# Patient Record
Sex: Male | Born: 1960 | Race: White | Hispanic: No | Marital: Married
Health system: Midwestern US, Academic
[De-identification: ages and names within clinical notes are randomized; demographics above are authoritative.]

---

## 2017-09-11 ENCOUNTER — Encounter: Admit: 2017-09-11 | Discharge: 2017-09-11 | Payer: MEDICARE

## 2017-09-14 ENCOUNTER — Encounter: Admit: 2017-09-14 | Discharge: 2017-09-14 | Payer: MEDICARE

## 2017-09-14 DIAGNOSIS — I10 Essential (primary) hypertension: ICD-10-CM

## 2017-09-14 DIAGNOSIS — E785 Hyperlipidemia, unspecified: Principal | ICD-10-CM

## 2017-09-18 ENCOUNTER — Encounter: Admit: 2017-09-18 | Discharge: 2017-09-18 | Payer: MEDICARE

## 2017-09-18 ENCOUNTER — Ambulatory Visit: Admit: 2017-09-18 | Discharge: 2017-09-19 | Payer: MEDICARE

## 2017-09-18 DIAGNOSIS — I2510 Atherosclerotic heart disease of native coronary artery without angina pectoris: Principal | ICD-10-CM

## 2017-09-18 DIAGNOSIS — E785 Hyperlipidemia, unspecified: Principal | ICD-10-CM

## 2017-09-18 DIAGNOSIS — I10 Essential (primary) hypertension: ICD-10-CM

## 2017-09-18 DIAGNOSIS — R0600 Dyspnea, unspecified: ICD-10-CM

## 2017-09-20 ENCOUNTER — Encounter: Admit: 2017-09-20 | Discharge: 2017-09-20 | Payer: MEDICARE

## 2017-10-01 ENCOUNTER — Encounter: Admit: 2017-10-01 | Discharge: 2017-10-01 | Payer: MEDICARE

## 2017-10-01 ENCOUNTER — Ambulatory Visit: Admit: 2017-10-01 | Discharge: 2017-10-02 | Payer: MEDICARE

## 2017-10-01 DIAGNOSIS — I7090 Unspecified atherosclerosis: Principal | ICD-10-CM

## 2017-10-10 ENCOUNTER — Encounter: Admit: 2017-10-10 | Discharge: 2017-10-10 | Payer: MEDICARE

## 2017-10-11 ENCOUNTER — Encounter: Admit: 2017-10-11 | Discharge: 2017-10-11 | Payer: MEDICARE

## 2017-10-11 DIAGNOSIS — I519 Heart disease, unspecified: ICD-10-CM

## 2017-10-11 DIAGNOSIS — E785 Hyperlipidemia, unspecified: Principal | ICD-10-CM

## 2017-10-11 DIAGNOSIS — I10 Essential (primary) hypertension: ICD-10-CM

## 2017-10-12 ENCOUNTER — Encounter: Admit: 2017-10-12 | Discharge: 2017-10-12 | Payer: MEDICARE

## 2017-10-12 ENCOUNTER — Ambulatory Visit: Admit: 2017-10-12 | Discharge: 2017-10-13 | Payer: MEDICARE

## 2017-10-12 DIAGNOSIS — G061 Intraspinal abscess and granuloma: ICD-10-CM

## 2017-10-12 DIAGNOSIS — I2510 Atherosclerotic heart disease of native coronary artery without angina pectoris: ICD-10-CM

## 2017-10-12 DIAGNOSIS — E785 Hyperlipidemia, unspecified: Principal | ICD-10-CM

## 2017-10-12 DIAGNOSIS — I10 Essential (primary) hypertension: ICD-10-CM

## 2017-10-12 MED ORDER — LEVETIRACETAM 500 MG PO TAB
500 mg | ORAL_TABLET | Freq: Two times a day (BID) | ORAL | 5 refills | 90.00000 days | Status: AC
Start: 2017-10-12 — End: 2018-04-02

## 2017-10-13 DIAGNOSIS — G253 Myoclonus: Principal | ICD-10-CM

## 2017-10-13 DIAGNOSIS — G959 Disease of spinal cord, unspecified: ICD-10-CM

## 2017-10-22 ENCOUNTER — Ambulatory Visit: Admit: 2017-10-22 | Discharge: 2017-10-22 | Payer: MEDICARE

## 2017-10-22 DIAGNOSIS — G253 Myoclonus: Principal | ICD-10-CM

## 2017-10-22 DIAGNOSIS — G959 Disease of spinal cord, unspecified: ICD-10-CM

## 2017-10-22 MED ORDER — GADOBENATE DIMEGLUMINE 529 MG/ML (0.1MMOL/0.2ML) IV SOLN
17 mL | Freq: Once | INTRAVENOUS | 0 refills | Status: CP
Start: 2017-10-22 — End: ?
  Administered 2017-10-22: 19:00:00 17 mL via INTRAVENOUS

## 2017-10-23 ENCOUNTER — Ambulatory Visit: Admit: 2017-10-23 | Discharge: 2017-10-23 | Payer: MEDICARE

## 2017-10-23 DIAGNOSIS — G253 Myoclonus: Principal | ICD-10-CM

## 2017-10-23 DIAGNOSIS — G959 Disease of spinal cord, unspecified: ICD-10-CM

## 2017-10-23 MED ORDER — GADOBENATE DIMEGLUMINE 529 MG/ML (0.1MMOL/0.2ML) IV SOLN
17 mL | Freq: Once | INTRAVENOUS | 0 refills | Status: CP
Start: 2017-10-23 — End: ?
  Administered 2017-10-23: 16:00:00 17 mL via INTRAVENOUS

## 2017-10-25 ENCOUNTER — Encounter: Admit: 2017-10-25 | Discharge: 2017-10-25 | Payer: MEDICARE

## 2017-10-31 ENCOUNTER — Encounter: Admit: 2017-10-31 | Discharge: 2017-10-31 | Payer: MEDICARE

## 2017-10-31 DIAGNOSIS — R9389 Abnormal findings on diagnostic imaging of other specified body structures: Principal | ICD-10-CM

## 2017-11-01 ENCOUNTER — Encounter: Admit: 2017-11-01 | Discharge: 2017-11-01 | Payer: MEDICARE

## 2017-11-08 ENCOUNTER — Ambulatory Visit: Admit: 2017-11-08 | Discharge: 2017-11-09 | Payer: MEDICARE

## 2017-11-08 ENCOUNTER — Encounter: Admit: 2017-11-08 | Discharge: 2017-11-08 | Payer: MEDICARE

## 2017-11-08 DIAGNOSIS — G061 Intraspinal abscess and granuloma: ICD-10-CM

## 2017-11-08 DIAGNOSIS — M5134 Other intervertebral disc degeneration, thoracic region: ICD-10-CM

## 2017-11-08 DIAGNOSIS — M4800 Spinal stenosis, site unspecified: ICD-10-CM

## 2017-11-08 DIAGNOSIS — I10 Essential (primary) hypertension: ICD-10-CM

## 2017-11-08 DIAGNOSIS — M47812 Spondylosis without myelopathy or radiculopathy, cervical region: Principal | ICD-10-CM

## 2017-11-08 DIAGNOSIS — T148XXA Other injury of unspecified body region, initial encounter: ICD-10-CM

## 2017-11-08 DIAGNOSIS — M5030 Other cervical disc degeneration, unspecified cervical region: ICD-10-CM

## 2017-11-08 DIAGNOSIS — I2510 Atherosclerotic heart disease of native coronary artery without angina pectoris: ICD-10-CM

## 2017-11-08 DIAGNOSIS — E785 Hyperlipidemia, unspecified: Principal | ICD-10-CM

## 2017-11-09 DIAGNOSIS — G959 Disease of spinal cord, unspecified: Secondary | ICD-10-CM

## 2017-11-09 DIAGNOSIS — G253 Myoclonus: ICD-10-CM

## 2017-11-12 ENCOUNTER — Encounter: Admit: 2017-11-12 | Discharge: 2017-11-12 | Payer: MEDICARE

## 2017-11-20 ENCOUNTER — Encounter: Admit: 2017-11-20 | Discharge: 2017-11-20 | Payer: MEDICARE

## 2017-11-21 ENCOUNTER — Encounter: Admit: 2017-11-21 | Discharge: 2017-11-21 | Payer: MEDICARE

## 2017-11-21 DIAGNOSIS — G959 Disease of spinal cord, unspecified: Principal | ICD-10-CM

## 2017-11-27 ENCOUNTER — Encounter: Admit: 2017-11-27 | Discharge: 2017-11-27 | Payer: MEDICARE

## 2017-11-27 ENCOUNTER — Ambulatory Visit: Admit: 2017-11-27 | Discharge: 2017-11-27 | Payer: MEDICARE

## 2017-11-27 DIAGNOSIS — M5030 Other cervical disc degeneration, unspecified cervical region: ICD-10-CM

## 2017-11-27 DIAGNOSIS — M5134 Other intervertebral disc degeneration, thoracic region: ICD-10-CM

## 2017-11-27 DIAGNOSIS — I10 Essential (primary) hypertension: ICD-10-CM

## 2017-11-27 DIAGNOSIS — M4800 Spinal stenosis, site unspecified: ICD-10-CM

## 2017-11-27 DIAGNOSIS — S14109S Unspecified injury at unspecified level of cervical spinal cord, sequela: ICD-10-CM

## 2017-11-27 DIAGNOSIS — M5412 Radiculopathy, cervical region: ICD-10-CM

## 2017-11-27 DIAGNOSIS — T148XXA Other injury of unspecified body region, initial encounter: ICD-10-CM

## 2017-11-27 DIAGNOSIS — R9389 Abnormal findings on diagnostic imaging of other specified body structures: ICD-10-CM

## 2017-11-27 DIAGNOSIS — R258 Other abnormal involuntary movements: ICD-10-CM

## 2017-11-27 DIAGNOSIS — I2510 Atherosclerotic heart disease of native coronary artery without angina pectoris: ICD-10-CM

## 2017-11-27 DIAGNOSIS — G959 Disease of spinal cord, unspecified: Principal | ICD-10-CM

## 2017-11-27 DIAGNOSIS — E785 Hyperlipidemia, unspecified: Principal | ICD-10-CM

## 2017-11-27 DIAGNOSIS — G061 Intraspinal abscess and granuloma: ICD-10-CM

## 2017-11-28 ENCOUNTER — Encounter: Admit: 2017-11-28 | Discharge: 2017-11-28 | Payer: MEDICARE

## 2017-11-30 ENCOUNTER — Encounter: Admit: 2017-11-30 | Discharge: 2017-11-30 | Payer: MEDICARE

## 2018-01-04 ENCOUNTER — Encounter: Admit: 2018-01-04 | Discharge: 2018-01-04 | Payer: MEDICARE

## 2018-01-04 ENCOUNTER — Ambulatory Visit: Admit: 2018-01-04 | Discharge: 2018-01-04 | Payer: MEDICARE

## 2018-01-04 ENCOUNTER — Encounter: Admit: 2018-01-04 | Discharge: 2018-01-05 | Payer: MEDICARE

## 2018-01-04 DIAGNOSIS — M5134 Other intervertebral disc degeneration, thoracic region: ICD-10-CM

## 2018-01-04 DIAGNOSIS — M5412 Radiculopathy, cervical region: ICD-10-CM

## 2018-01-04 DIAGNOSIS — I10 Essential (primary) hypertension: ICD-10-CM

## 2018-01-04 DIAGNOSIS — M4800 Spinal stenosis, site unspecified: ICD-10-CM

## 2018-01-04 DIAGNOSIS — E785 Hyperlipidemia, unspecified: Principal | ICD-10-CM

## 2018-01-04 DIAGNOSIS — S14109S Unspecified injury at unspecified level of cervical spinal cord, sequela: ICD-10-CM

## 2018-01-04 DIAGNOSIS — T148XXA Other injury of unspecified body region, initial encounter: ICD-10-CM

## 2018-01-04 DIAGNOSIS — G061 Intraspinal abscess and granuloma: ICD-10-CM

## 2018-01-04 DIAGNOSIS — G959 Disease of spinal cord, unspecified: Principal | ICD-10-CM

## 2018-01-04 DIAGNOSIS — R258 Other abnormal involuntary movements: ICD-10-CM

## 2018-01-04 DIAGNOSIS — M5030 Other cervical disc degeneration, unspecified cervical region: ICD-10-CM

## 2018-01-04 DIAGNOSIS — I2510 Atherosclerotic heart disease of native coronary artery without angina pectoris: ICD-10-CM

## 2018-01-04 MED ORDER — DEXAMETHASONE SODIUM PHOS (PF) 10 MG/ML IJ SOLN
10 mg | Freq: Once | 0 refills | Status: CP
Start: 2018-01-04 — End: ?

## 2018-01-04 MED ORDER — IOPAMIDOL 41 % IT SOLN
2.5 mL | Freq: Once | EPIDURAL | 0 refills | Status: CP
Start: 2018-01-04 — End: ?

## 2018-01-06 ENCOUNTER — Encounter: Admit: 2018-01-06 | Discharge: 2018-01-06 | Payer: MEDICARE

## 2018-01-10 ENCOUNTER — Encounter: Admit: 2018-01-10 | Discharge: 2018-01-10 | Payer: MEDICARE

## 2018-01-18 ENCOUNTER — Encounter: Admit: 2018-01-18 | Discharge: 2018-01-18 | Payer: MEDICARE

## 2018-01-18 DIAGNOSIS — M5412 Radiculopathy, cervical region: Principal | ICD-10-CM

## 2018-01-23 ENCOUNTER — Encounter: Admit: 2018-01-23 | Discharge: 2018-01-23 | Payer: MEDICARE

## 2018-02-11 ENCOUNTER — Encounter: Admit: 2018-02-11 | Discharge: 2018-02-11 | Payer: MEDICARE

## 2018-02-11 DIAGNOSIS — M4800 Spinal stenosis, site unspecified: ICD-10-CM

## 2018-02-11 DIAGNOSIS — I10 Essential (primary) hypertension: ICD-10-CM

## 2018-02-11 DIAGNOSIS — E785 Hyperlipidemia, unspecified: Principal | ICD-10-CM

## 2018-02-11 DIAGNOSIS — T148XXA Other injury of unspecified body region, initial encounter: ICD-10-CM

## 2018-02-11 DIAGNOSIS — I2510 Atherosclerotic heart disease of native coronary artery without angina pectoris: ICD-10-CM

## 2018-02-11 DIAGNOSIS — G061 Intraspinal abscess and granuloma: ICD-10-CM

## 2018-02-11 DIAGNOSIS — M5134 Other intervertebral disc degeneration, thoracic region: ICD-10-CM

## 2018-02-11 DIAGNOSIS — M5030 Other cervical disc degeneration, unspecified cervical region: ICD-10-CM

## 2018-02-13 ENCOUNTER — Encounter: Admit: 2018-02-13 | Discharge: 2018-02-13 | Payer: MEDICARE

## 2018-02-15 ENCOUNTER — Encounter: Admit: 2018-02-15 | Discharge: 2018-02-15 | Payer: MEDICARE

## 2018-02-15 ENCOUNTER — Ambulatory Visit: Admit: 2018-02-15 | Discharge: 2018-02-15 | Payer: MEDICARE

## 2018-02-15 DIAGNOSIS — M5412 Radiculopathy, cervical region: Principal | ICD-10-CM

## 2018-02-15 DIAGNOSIS — M4800 Spinal stenosis, site unspecified: ICD-10-CM

## 2018-02-15 DIAGNOSIS — M5134 Other intervertebral disc degeneration, thoracic region: ICD-10-CM

## 2018-02-15 DIAGNOSIS — T148XXA Other injury of unspecified body region, initial encounter: ICD-10-CM

## 2018-02-15 DIAGNOSIS — M5030 Other cervical disc degeneration, unspecified cervical region: ICD-10-CM

## 2018-02-15 DIAGNOSIS — I10 Essential (primary) hypertension: ICD-10-CM

## 2018-02-15 DIAGNOSIS — I2510 Atherosclerotic heart disease of native coronary artery without angina pectoris: ICD-10-CM

## 2018-02-15 DIAGNOSIS — G061 Intraspinal abscess and granuloma: ICD-10-CM

## 2018-02-15 DIAGNOSIS — E785 Hyperlipidemia, unspecified: Principal | ICD-10-CM

## 2018-02-15 MED ORDER — IOPAMIDOL 41 % IT SOLN
2.5 mL | Freq: Once | EPIDURAL | 0 refills | Status: CP
Start: 2018-02-15 — End: ?

## 2018-02-15 MED ORDER — LIDOCAINE (PF) 10 MG/ML (1 %) IJ SOLN
2 mL | Freq: Once | INTRAMUSCULAR | 0 refills | Status: CP
Start: 2018-02-15 — End: ?

## 2018-02-15 MED ORDER — DEXAMETHASONE SODIUM PHOS (PF) 10 MG/ML IJ SOLN
10 mg | Freq: Once | 0 refills | Status: CP
Start: 2018-02-15 — End: ?

## 2018-02-26 ENCOUNTER — Encounter: Admit: 2018-02-26 | Discharge: 2018-02-26 | Payer: MEDICARE

## 2018-02-26 ENCOUNTER — Ambulatory Visit: Admit: 2018-02-26 | Discharge: 2018-02-27 | Payer: MEDICARE

## 2018-02-26 DIAGNOSIS — M4800 Spinal stenosis, site unspecified: ICD-10-CM

## 2018-02-26 DIAGNOSIS — G061 Intraspinal abscess and granuloma: ICD-10-CM

## 2018-02-26 DIAGNOSIS — M5030 Other cervical disc degeneration, unspecified cervical region: ICD-10-CM

## 2018-02-26 DIAGNOSIS — I10 Essential (primary) hypertension: ICD-10-CM

## 2018-02-26 DIAGNOSIS — E785 Hyperlipidemia, unspecified: Principal | ICD-10-CM

## 2018-02-26 DIAGNOSIS — M5134 Other intervertebral disc degeneration, thoracic region: ICD-10-CM

## 2018-02-26 DIAGNOSIS — T148XXA Other injury of unspecified body region, initial encounter: ICD-10-CM

## 2018-02-26 DIAGNOSIS — I2510 Atherosclerotic heart disease of native coronary artery without angina pectoris: ICD-10-CM

## 2018-02-27 DIAGNOSIS — G959 Disease of spinal cord, unspecified: Secondary | ICD-10-CM

## 2018-02-27 DIAGNOSIS — R258 Other abnormal involuntary movements: ICD-10-CM

## 2018-02-27 DIAGNOSIS — M5412 Radiculopathy, cervical region: Principal | ICD-10-CM

## 2018-02-27 DIAGNOSIS — S14109S Unspecified injury at unspecified level of cervical spinal cord, sequela: ICD-10-CM

## 2018-03-08 ENCOUNTER — Encounter: Admit: 2018-03-08 | Discharge: 2018-03-08 | Payer: MEDICARE

## 2018-03-08 DIAGNOSIS — S14109D Unspecified injury at unspecified level of cervical spinal cord, subsequent encounter: ICD-10-CM

## 2018-03-08 DIAGNOSIS — Z419 Encounter for procedure for purposes other than remedying health state, unspecified: ICD-10-CM

## 2018-03-08 DIAGNOSIS — M5412 Radiculopathy, cervical region: Principal | ICD-10-CM

## 2018-03-08 DIAGNOSIS — G959 Disease of spinal cord, unspecified: ICD-10-CM

## 2018-03-12 ENCOUNTER — Encounter: Admit: 2018-03-12 | Discharge: 2018-03-12 | Payer: MEDICARE

## 2018-03-12 DIAGNOSIS — S14109D Unspecified injury at unspecified level of cervical spinal cord, subsequent encounter: ICD-10-CM

## 2018-04-02 ENCOUNTER — Encounter: Admit: 2018-04-02 | Discharge: 2018-04-02 | Payer: MEDICARE

## 2018-04-02 MED ORDER — LEVETIRACETAM 500 MG PO TAB
500 mg | ORAL_TABLET | Freq: Two times a day (BID) | ORAL | 0 refills | 90.00000 days | Status: AC
Start: 2018-04-02 — End: 2018-05-07

## 2018-05-06 ENCOUNTER — Encounter: Admit: 2018-05-06 | Discharge: 2018-05-06 | Payer: MEDICARE

## 2018-05-07 ENCOUNTER — Encounter: Admit: 2018-05-07 | Discharge: 2018-05-07 | Payer: MEDICARE

## 2018-05-07 MED ORDER — LEVETIRACETAM 500 MG PO TAB
500 mg | ORAL_TABLET | Freq: Two times a day (BID) | ORAL | 1 refills | 90.00000 days | Status: AC
Start: 2018-05-07 — End: 2018-07-05

## 2018-06-04 ENCOUNTER — Encounter: Admit: 2018-06-04 | Discharge: 2018-06-04 | Payer: MEDICARE

## 2018-06-11 ENCOUNTER — Encounter: Admit: 2018-06-11 | Discharge: 2018-06-11 | Payer: MEDICARE

## 2018-06-11 ENCOUNTER — Ambulatory Visit: Admit: 2018-06-11 | Discharge: 2018-06-12 | Payer: MEDICARE

## 2018-06-11 DIAGNOSIS — I2510 Atherosclerotic heart disease of native coronary artery without angina pectoris: Principal | ICD-10-CM

## 2018-07-05 ENCOUNTER — Encounter: Admit: 2018-07-05 | Discharge: 2018-07-05 | Payer: MEDICARE

## 2018-07-05 ENCOUNTER — Ambulatory Visit: Admit: 2018-07-05 | Discharge: 2018-07-05 | Payer: MEDICARE

## 2018-07-05 DIAGNOSIS — G061 Intraspinal abscess and granuloma: Secondary | ICD-10-CM

## 2018-07-05 DIAGNOSIS — E785 Hyperlipidemia, unspecified: Secondary | ICD-10-CM

## 2018-07-05 DIAGNOSIS — G253 Myoclonus: Secondary | ICD-10-CM

## 2018-07-05 DIAGNOSIS — R208 Other disturbances of skin sensation: Secondary | ICD-10-CM

## 2018-07-05 DIAGNOSIS — T148XXA Other injury of unspecified body region, initial encounter: Secondary | ICD-10-CM

## 2018-07-05 DIAGNOSIS — I2510 Atherosclerotic heart disease of native coronary artery without angina pectoris: Secondary | ICD-10-CM

## 2018-07-05 DIAGNOSIS — M5030 Other cervical disc degeneration, unspecified cervical region: Secondary | ICD-10-CM

## 2018-07-05 DIAGNOSIS — M5134 Other intervertebral disc degeneration, thoracic region: Secondary | ICD-10-CM

## 2018-07-05 DIAGNOSIS — G959 Disease of spinal cord, unspecified: Secondary | ICD-10-CM

## 2018-07-05 DIAGNOSIS — M4800 Spinal stenosis, site unspecified: Secondary | ICD-10-CM

## 2018-07-05 DIAGNOSIS — I10 Essential (primary) hypertension: Secondary | ICD-10-CM

## 2018-07-05 LAB — COMPREHENSIVE METABOLIC PANEL
Lab: 0.7 mg/dL (ref 0.3–1.2)
Lab: 1.2 mg/dL (ref 0.4–1.24)
Lab: 104 MMOL/L (ref 98–110)
Lab: 138 MMOL/L (ref 137–147)
Lab: 24 MMOL/L (ref 21–30)
Lab: 3.9 MMOL/L (ref 3.5–5.1)
Lab: 34 U/L (ref 7–40)
Lab: 4.4 g/dL (ref 3.5–5.0)
Lab: 56 U/L (ref 25–110)
Lab: 6.9 g/dL (ref 6.0–8.0)
Lab: 68 U/L — ABNORMAL HIGH (ref 7–56)
Lab: 9 mg/dL (ref 7–25)
Lab: 9.3 mg/dL (ref 8.5–10.6)
Lab: 94 mg/dL (ref 70–100)
Lab: 10 (ref 3–12)

## 2018-07-05 LAB — FOLATE, SERUM: Lab: 14 ng/mL (ref >3.9)

## 2018-07-05 LAB — SED RATE: Lab: 6 mm/h (ref 0–20)

## 2018-07-05 LAB — VITAMIN B12: Lab: 486 pg/mL (ref 180–914)

## 2018-07-05 MED ORDER — GABAPENTIN 600 MG PO TAB
ORAL_TABLET | Freq: Three times a day (TID) | 3 refills | Status: AC
Start: 2018-07-05 — End: 2019-04-01

## 2018-07-05 MED ORDER — LEVETIRACETAM 1,000 MG PO TAB
1000 mg | ORAL_TABLET | Freq: Two times a day (BID) | ORAL | 3 refills | 90.00000 days | Status: AC
Start: 2018-07-05 — End: 2019-04-01

## 2018-07-06 LAB — ZINC: Lab: 0.8

## 2018-07-06 LAB — COPPER: Lab: 1.1

## 2018-07-06 LAB — HIV 1& 2 AG-AB SCRN W REFLEX HIV 1 PCR QUANT: Lab: NEGATIVE

## 2018-07-07 LAB — SYPHILIS AB SCREEN: Lab: POSITIVE — AB

## 2018-07-08 ENCOUNTER — Encounter: Admit: 2018-07-08 | Discharge: 2018-07-08 | Payer: MEDICARE

## 2018-07-08 LAB — ANTI-NUCLEAR ANTIBODY(ANA): Lab: <80 {titer} (ref <80)

## 2018-07-08 LAB — RPR SCREEN

## 2018-07-08 LAB — PYRIDOXAL 5 PHOSPHATE: Lab: 8

## 2018-07-09 LAB — VITAMIN E: Lab: 11

## 2018-07-09 LAB — METHYLMALONIC ACID QUANT: Lab: 0.1

## 2018-07-10 LAB — T. PALLIDUM AB TP-PA

## 2018-07-12 ENCOUNTER — Encounter: Admit: 2018-07-12 | Discharge: 2018-07-12 | Payer: MEDICARE

## 2018-07-29 ENCOUNTER — Encounter: Admit: 2018-07-29 | Discharge: 2018-07-29 | Payer: MEDICARE

## 2018-08-07 ENCOUNTER — Encounter: Admit: 2018-08-07 | Discharge: 2018-08-07 | Payer: MEDICARE

## 2018-08-07 DIAGNOSIS — G959 Disease of spinal cord, unspecified: Principal | ICD-10-CM

## 2018-08-08 ENCOUNTER — Encounter: Admit: 2018-08-08 | Discharge: 2018-08-08 | Payer: MEDICARE

## 2018-08-08 ENCOUNTER — Ambulatory Visit: Admit: 2018-08-08 | Discharge: 2018-08-08 | Payer: MEDICARE

## 2018-08-08 DIAGNOSIS — T148XXA Other injury of unspecified body region, initial encounter: ICD-10-CM

## 2018-08-08 DIAGNOSIS — I2510 Atherosclerotic heart disease of native coronary artery without angina pectoris: ICD-10-CM

## 2018-08-08 DIAGNOSIS — G959 Disease of spinal cord, unspecified: Principal | ICD-10-CM

## 2018-08-08 DIAGNOSIS — S14109S Unspecified injury at unspecified level of cervical spinal cord, sequela: ICD-10-CM

## 2018-08-08 DIAGNOSIS — M5030 Other cervical disc degeneration, unspecified cervical region: ICD-10-CM

## 2018-08-08 DIAGNOSIS — M5134 Other intervertebral disc degeneration, thoracic region: ICD-10-CM

## 2018-08-08 DIAGNOSIS — M5412 Radiculopathy, cervical region: ICD-10-CM

## 2018-08-08 DIAGNOSIS — I10 Essential (primary) hypertension: ICD-10-CM

## 2018-08-08 DIAGNOSIS — S14109D Unspecified injury at unspecified level of cervical spinal cord, subsequent encounter: ICD-10-CM

## 2018-08-08 DIAGNOSIS — Z419 Encounter for procedure for purposes other than remedying health state, unspecified: ICD-10-CM

## 2018-08-08 DIAGNOSIS — R258 Other abnormal involuntary movements: ICD-10-CM

## 2018-08-08 DIAGNOSIS — G061 Intraspinal abscess and granuloma: ICD-10-CM

## 2018-08-08 DIAGNOSIS — M4800 Spinal stenosis, site unspecified: ICD-10-CM

## 2018-08-08 DIAGNOSIS — E785 Hyperlipidemia, unspecified: Principal | ICD-10-CM

## 2018-08-08 NOTE — Progress Notes
Darin A. Clydene Pugh, MD [facility]  Follow - Up Visit  Subjective     REASON FOR VISIT   Follow Up    SUBJECTIVE     [name] returns for repeat evaluation.  He continues to complain of right arm pain from the elbow distally and into all five fingers.  He has numbness and tingling in the small and ring fingers.  He has long-standing tingling in his thumb from his previous spinal cord injury.  He had his upper arm tattoo redone in December.  He states he was unable to feel a portion of the tattoo when it was being done.  He describes popping in his neck.  He would like to discuss surgery.  He is looking at doing something in June.         ROS: Review of Systems   All other systems reviewed and are negative.    A 10-point ROS was performed and negative.    PHYSICAL EXAM   Blood pressure 148/84, pulse 65, height 172.7 cm (67.99), weight 103 kg (227 lb), SpO2 100 %.  Body mass index is 34.52 kg/m???Percell Belt Total Score:: 60  Pain Score: Eight    Constitutional: Alert, NAD  Head: Atraumatic  Eyes: EOMI  Respiratory: Unlabored breathing  Cardiovascular: Regular rate  Skin: No rashes or open wounds appreciated on back  Musculoskeletal: Strength stable  Neurologic: Sensation stable    Upper Extremity Motor Exam   Deltoid Biceps Triceps WF WE Intrinsics Grip   RIGHT   4 4 5 5 5 5 5    LEFT 5 5 5 5 5 5 5       With muscle testing, he gets sustained clonus on his right upper extremity.  5/5 triceps, but it does stimulate the clonus once again.      RADIOGRAPHS     AP lateral serve allograft spacer spine films are available for review.  These demonstrate previous interbody fusions from C3-4 and C4-5.  Similar in appearance to previous exam.  No significant subluxations or fractures or instabilities.       ASSESSMENT / PLAN     Darin [name] is a 58 y.o. male with:    1. Cervical myelopathy (HCC)  MRI C-SPINE WO CONTRAST    CT SPINE CERVICAL WO CONTRAST

## 2018-08-27 ENCOUNTER — Ambulatory Visit: Admit: 2018-08-27 | Discharge: 2018-08-28 | Payer: MEDICARE

## 2018-08-27 ENCOUNTER — Ambulatory Visit: Admit: 2018-08-27 | Discharge: 2018-08-27 | Payer: MEDICARE

## 2018-08-27 DIAGNOSIS — S14109S Unspecified injury at unspecified level of cervical spinal cord, sequela: ICD-10-CM

## 2018-08-27 DIAGNOSIS — G959 Disease of spinal cord, unspecified: Principal | ICD-10-CM

## 2018-08-27 DIAGNOSIS — M5412 Radiculopathy, cervical region: ICD-10-CM

## 2018-08-27 DIAGNOSIS — Z419 Encounter for procedure for purposes other than remedying health state, unspecified: ICD-10-CM

## 2018-08-27 DIAGNOSIS — R258 Other abnormal involuntary movements: ICD-10-CM

## 2018-08-27 DIAGNOSIS — S14109D Unspecified injury at unspecified level of cervical spinal cord, subsequent encounter: Secondary | ICD-10-CM

## 2018-08-30 ENCOUNTER — Encounter: Admit: 2018-08-30 | Discharge: 2018-08-30 | Payer: MEDICARE

## 2018-08-30 ENCOUNTER — Encounter: Admit: 2018-08-30 | Discharge: 2018-08-31 | Payer: MEDICARE
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
## 2018-09-01 ENCOUNTER — Encounter: Admit: 2018-09-01 | Discharge: 2018-09-01 | Payer: MEDICARE

## 2018-09-03 ENCOUNTER — Ambulatory Visit: Admit: 2018-09-03 | Discharge: 2018-09-04 | Payer: MEDICARE

## 2018-09-03 ENCOUNTER — Encounter: Admit: 2018-09-03 | Discharge: 2018-09-03 | Payer: MEDICARE

## 2018-09-03 DIAGNOSIS — G061 Intraspinal abscess and granuloma: ICD-10-CM

## 2018-09-03 DIAGNOSIS — M4800 Spinal stenosis, site unspecified: ICD-10-CM

## 2018-09-03 DIAGNOSIS — E785 Hyperlipidemia, unspecified: Principal | ICD-10-CM

## 2018-09-03 DIAGNOSIS — I10 Essential (primary) hypertension: ICD-10-CM

## 2018-09-03 DIAGNOSIS — S14109S Unspecified injury at unspecified level of cervical spinal cord, sequela: Secondary | ICD-10-CM

## 2018-09-03 DIAGNOSIS — S14109D Unspecified injury at unspecified level of cervical spinal cord, subsequent encounter: Secondary | ICD-10-CM

## 2018-09-03 DIAGNOSIS — I2510 Atherosclerotic heart disease of native coronary artery without angina pectoris: ICD-10-CM

## 2018-09-03 DIAGNOSIS — T148XXA Other injury of unspecified body region, initial encounter: ICD-10-CM

## 2018-09-03 DIAGNOSIS — M5412 Radiculopathy, cervical region: Secondary | ICD-10-CM

## 2018-09-03 DIAGNOSIS — M5134 Other intervertebral disc degeneration, thoracic region: ICD-10-CM

## 2018-09-03 DIAGNOSIS — M5030 Other cervical disc degeneration, unspecified cervical region: ICD-10-CM

## 2018-09-03 MED ORDER — NAPROXEN 500 MG PO TAB
500 mg | ORAL_TABLET | Freq: Two times a day (BID) | ORAL | 2 refills | Status: AC
Start: 2018-09-03 — End: 2018-09-03

## 2018-09-03 MED ORDER — NAPROXEN 500 MG PO TAB
500 mg | ORAL_TABLET | Freq: Two times a day (BID) | ORAL | 2 refills | Status: AC
Start: 2018-09-03 — End: 2019-10-28

## 2018-09-04 ENCOUNTER — Encounter: Admit: 2018-09-04 | Discharge: 2018-09-04 | Payer: MEDICARE

## 2018-09-04 DIAGNOSIS — Z419 Encounter for procedure for purposes other than remedying health state, unspecified: ICD-10-CM

## 2018-09-04 DIAGNOSIS — R2689 Other abnormalities of gait and mobility: ICD-10-CM

## 2018-09-04 DIAGNOSIS — G959 Disease of spinal cord, unspecified: Principal | ICD-10-CM

## 2018-09-04 DIAGNOSIS — S14106D Unspecified injury at C6 level of cervical spinal cord, subsequent encounter: ICD-10-CM

## 2018-09-04 DIAGNOSIS — R200 Anesthesia of skin: Secondary | ICD-10-CM

## 2018-09-04 DIAGNOSIS — R29898 Other symptoms and signs involving the musculoskeletal system: ICD-10-CM

## 2018-09-04 DIAGNOSIS — S14105D Unspecified injury at C5 level of cervical spinal cord, subsequent encounter: ICD-10-CM

## 2018-09-04 DIAGNOSIS — R258 Other abnormal involuntary movements: Secondary | ICD-10-CM

## 2018-09-06 ENCOUNTER — Encounter: Admit: 2018-09-06 | Discharge: 2018-09-06 | Payer: MEDICARE

## 2018-09-09 ENCOUNTER — Ambulatory Visit: Admit: 2018-09-09 | Discharge: 2018-09-09 | Payer: MEDICARE

## 2018-09-09 DIAGNOSIS — R200 Anesthesia of skin: ICD-10-CM

## 2018-09-09 DIAGNOSIS — S14109D Unspecified injury at unspecified level of cervical spinal cord, subsequent encounter: ICD-10-CM

## 2018-09-09 DIAGNOSIS — S14109S Unspecified injury at unspecified level of cervical spinal cord, sequela: ICD-10-CM

## 2018-09-09 DIAGNOSIS — R258 Other abnormal involuntary movements: ICD-10-CM

## 2018-09-09 DIAGNOSIS — Z419 Encounter for procedure for purposes other than remedying health state, unspecified: Secondary | ICD-10-CM

## 2018-09-09 DIAGNOSIS — G959 Disease of spinal cord, unspecified: Principal | ICD-10-CM

## 2018-09-09 DIAGNOSIS — M5412 Radiculopathy, cervical region: ICD-10-CM

## 2018-09-09 DIAGNOSIS — R29898 Other symptoms and signs involving the musculoskeletal system: ICD-10-CM

## 2018-09-09 DIAGNOSIS — R2689 Other abnormalities of gait and mobility: ICD-10-CM

## 2018-09-11 ENCOUNTER — Encounter: Admit: 2018-09-11 | Discharge: 2018-09-11 | Payer: MEDICARE

## 2018-09-17 ENCOUNTER — Encounter: Admit: 2018-09-17 | Discharge: 2018-09-17 | Payer: MEDICARE

## 2018-09-17 ENCOUNTER — Ambulatory Visit: Admit: 2018-09-17 | Discharge: 2018-09-18 | Payer: MEDICARE

## 2018-09-17 DIAGNOSIS — M4800 Spinal stenosis, site unspecified: ICD-10-CM

## 2018-09-17 DIAGNOSIS — I10 Essential (primary) hypertension: ICD-10-CM

## 2018-09-17 DIAGNOSIS — T148XXA Other injury of unspecified body region, initial encounter: ICD-10-CM

## 2018-09-17 DIAGNOSIS — M5030 Other cervical disc degeneration, unspecified cervical region: ICD-10-CM

## 2018-09-17 DIAGNOSIS — E785 Hyperlipidemia, unspecified: Principal | ICD-10-CM

## 2018-09-17 DIAGNOSIS — I2510 Atherosclerotic heart disease of native coronary artery without angina pectoris: ICD-10-CM

## 2018-09-17 DIAGNOSIS — M5134 Other intervertebral disc degeneration, thoracic region: ICD-10-CM

## 2018-09-17 DIAGNOSIS — R258 Other abnormal involuntary movements: Secondary | ICD-10-CM

## 2018-09-17 DIAGNOSIS — G061 Intraspinal abscess and granuloma: ICD-10-CM

## 2018-09-17 NOTE — Patient Instructions
It was a pleasure seeing you in clinic today.  You will be receiving a survey about today's clinic visit.  We encourage you to complete the survey.  Your opinion matters to us.  We need your feedback to help us continue to build on our strengths as well as look at areas for improvement.  Please don't hesitate to call if you have any questions.      Trevon Strothers BSN, RN, CNOR  Clinical Nurse Coordinator  Dr. Brandon Carlson  [facility]  [facility]  [address]  [place],  [address]  [email or web address]  Phone: [phone]  Scheduling [phone]

## 2018-09-18 DIAGNOSIS — S14109D Unspecified injury at unspecified level of cervical spinal cord, subsequent encounter: Principal | ICD-10-CM

## 2018-09-18 DIAGNOSIS — S14109S Unspecified injury at unspecified level of cervical spinal cord, sequela: ICD-10-CM

## 2018-09-18 DIAGNOSIS — R2689 Other abnormalities of gait and mobility: Secondary | ICD-10-CM

## 2018-09-18 DIAGNOSIS — G959 Disease of spinal cord, unspecified: ICD-10-CM

## 2018-09-18 DIAGNOSIS — R200 Anesthesia of skin: ICD-10-CM

## 2018-09-18 DIAGNOSIS — M5412 Radiculopathy, cervical region: Secondary | ICD-10-CM

## 2018-09-18 DIAGNOSIS — R29898 Other symptoms and signs involving the musculoskeletal system: ICD-10-CM

## 2018-10-08 ENCOUNTER — Encounter: Admit: 2018-10-08 | Discharge: 2018-10-08 | Payer: MEDICARE

## 2019-03-26 IMAGING — US ECHOCOMPL
1 series · 14 of 24 positions shown · non-contrast
Comparison: none

[Series 1: us echo 2d, wo/w m-mode, compl · 92 acquisitions, 14 frames shown]
[im 1/92]
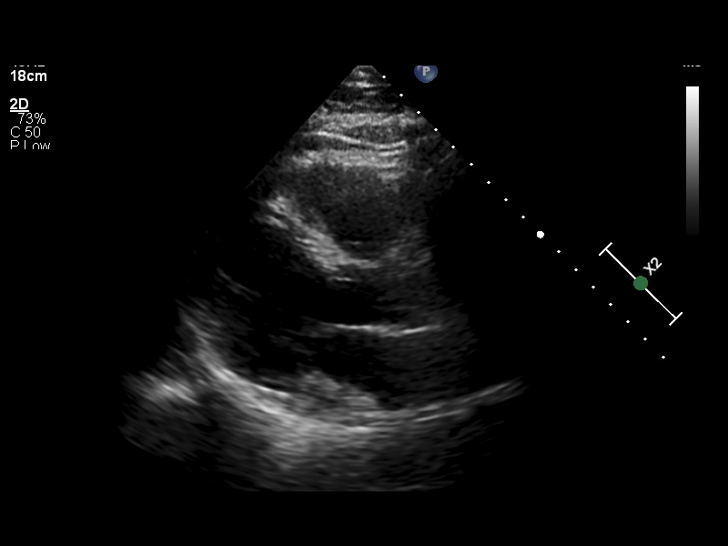
[im 8/92]
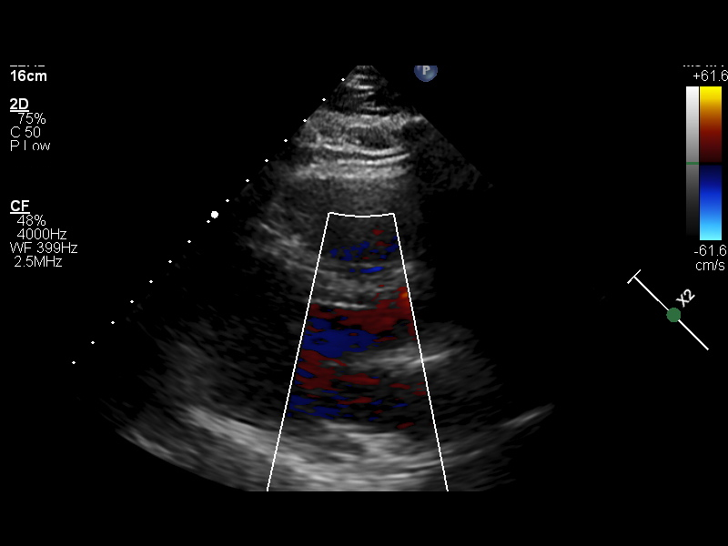
[im 16/92]
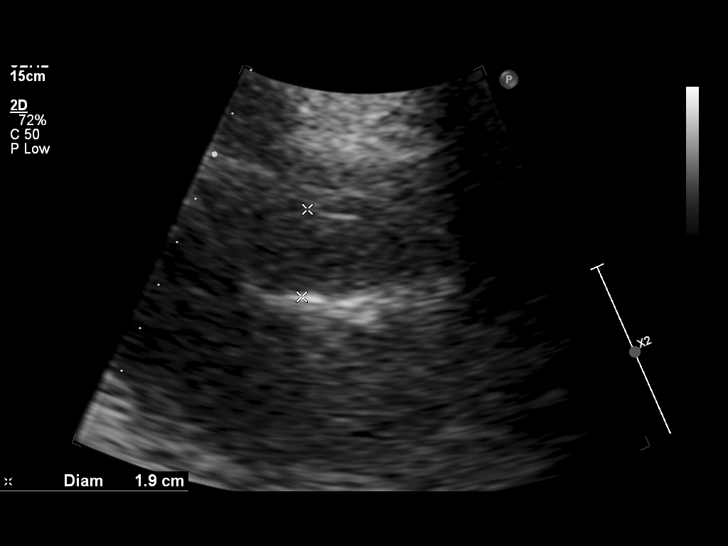
[im 24/92]
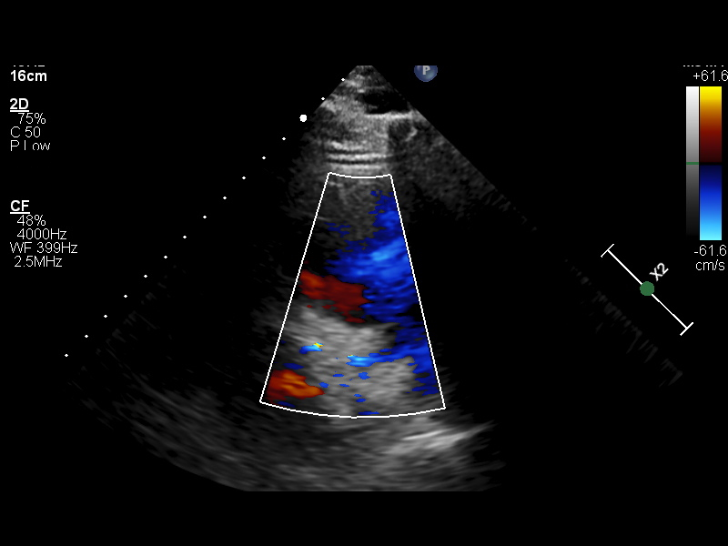
[im 28/92]
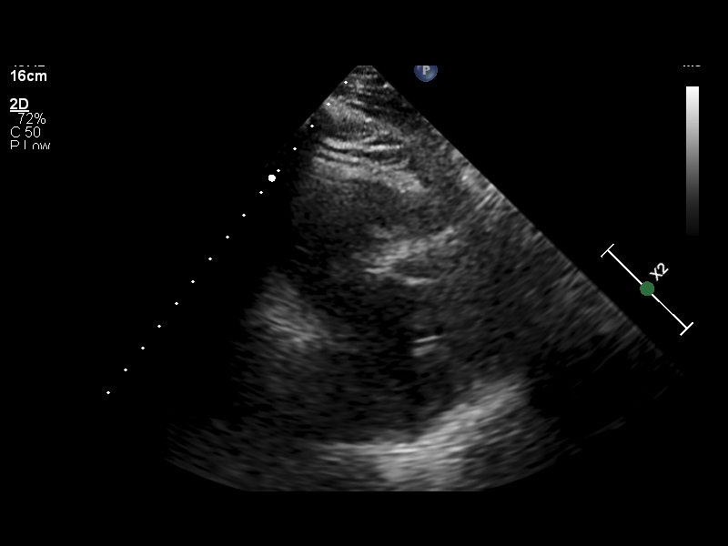
[im 32/92]
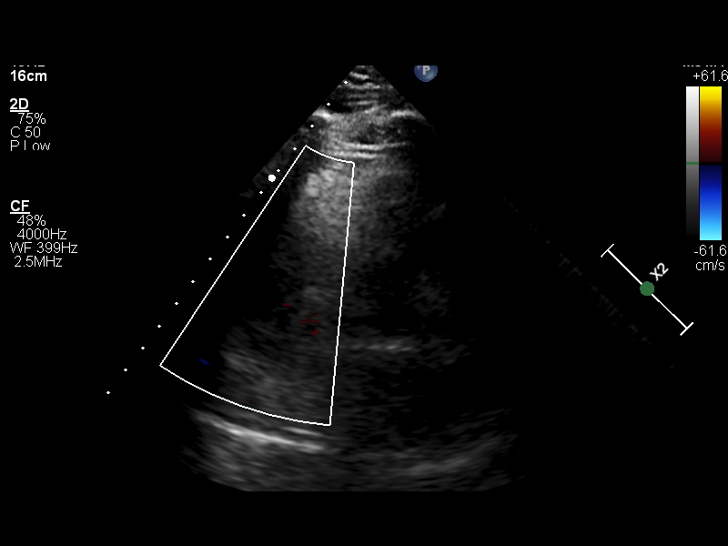
[im 40/92]
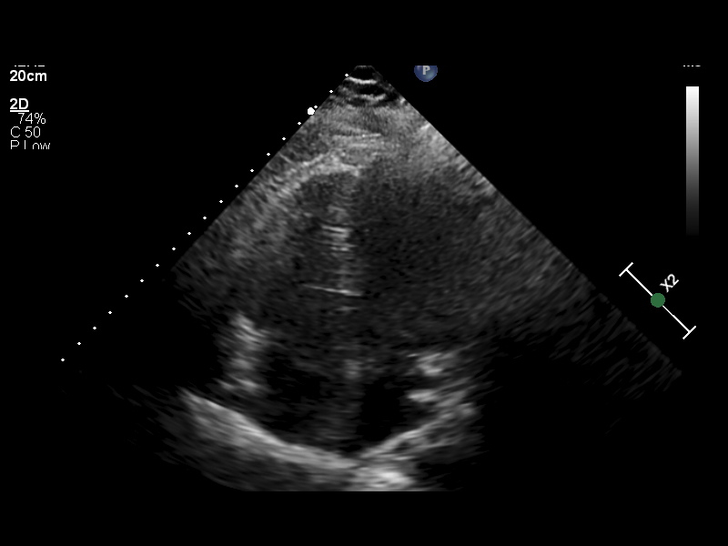
[im 44/92]
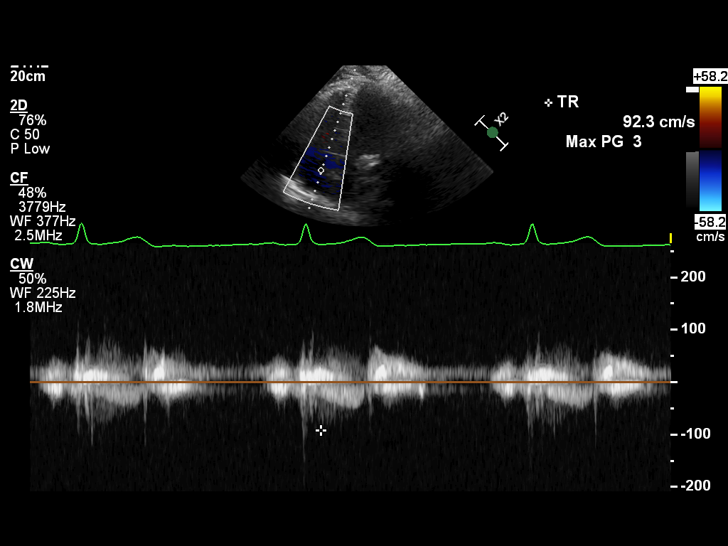
[im 52/92]
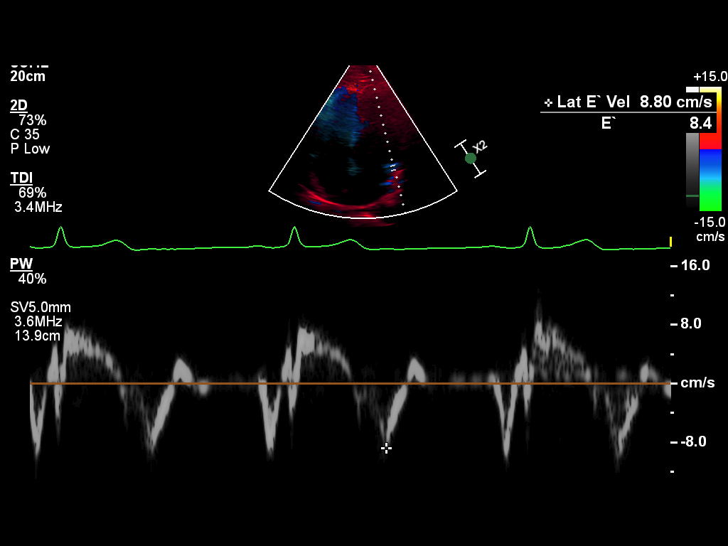
[im 60/92]
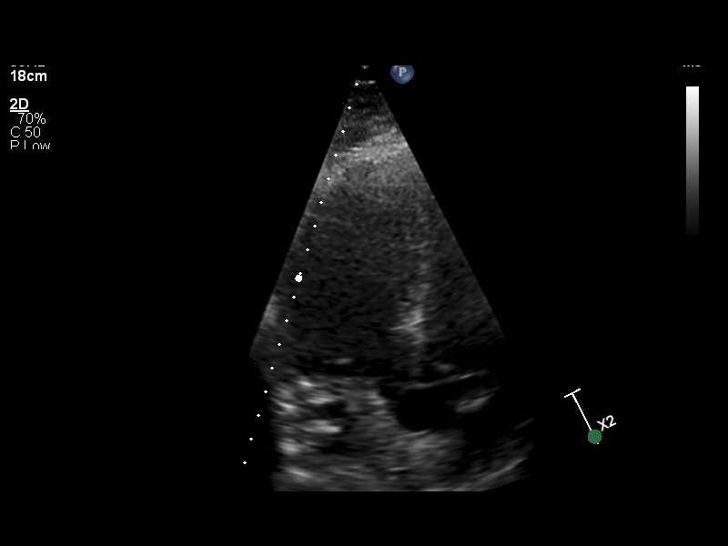
[im 68/92]
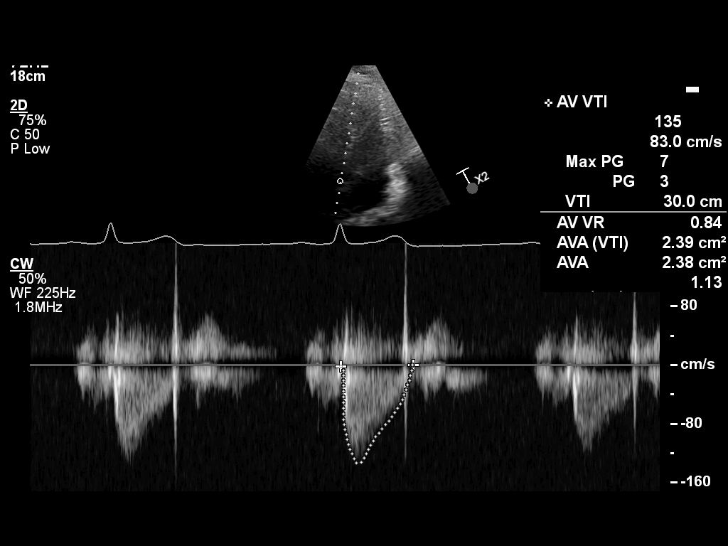
[im 72/92]
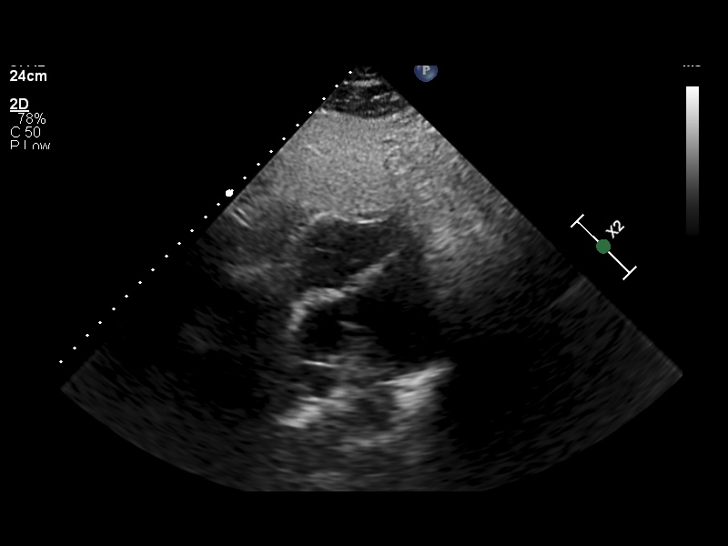
[im 80/92]
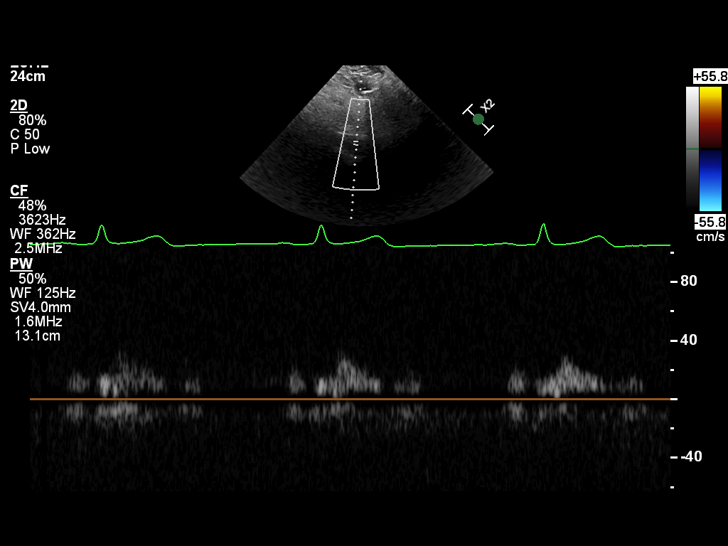
[im 92/92]
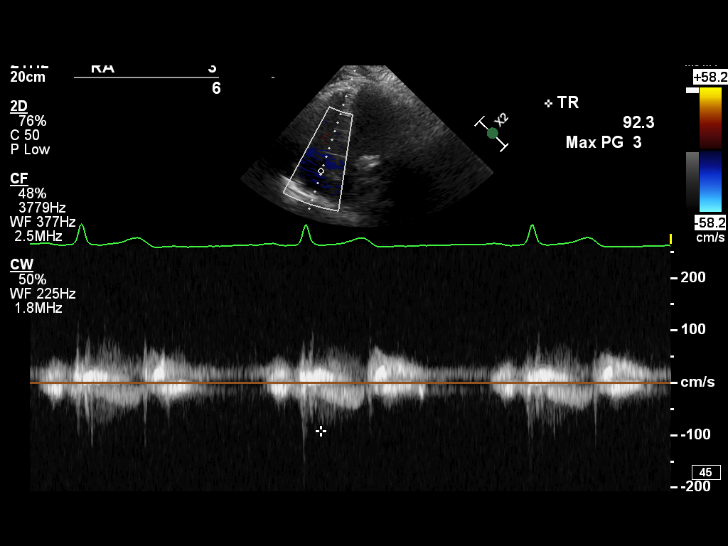

[14 of 24 positions shown; findings below may reference images not displayed]

FINAL REPORT IS SCANNED IN THE PATIENT'S EMR.

Tech Notes:

## 2019-04-01 ENCOUNTER — Encounter

## 2019-04-01 DIAGNOSIS — G061 Intraspinal abscess and granuloma: Secondary | ICD-10-CM

## 2019-04-01 DIAGNOSIS — I2510 Atherosclerotic heart disease of native coronary artery without angina pectoris: Secondary | ICD-10-CM

## 2019-04-01 DIAGNOSIS — M5134 Other intervertebral disc degeneration, thoracic region: Secondary | ICD-10-CM

## 2019-04-01 DIAGNOSIS — G959 Disease of spinal cord, unspecified: Secondary | ICD-10-CM

## 2019-04-01 DIAGNOSIS — T148XXA Other injury of unspecified body region, initial encounter: Secondary | ICD-10-CM

## 2019-04-01 DIAGNOSIS — M4800 Spinal stenosis, site unspecified: Secondary | ICD-10-CM

## 2019-04-01 DIAGNOSIS — G253 Myoclonus: Secondary | ICD-10-CM

## 2019-04-01 DIAGNOSIS — I10 Essential (primary) hypertension: Secondary | ICD-10-CM

## 2019-04-01 DIAGNOSIS — M5030 Other cervical disc degeneration, unspecified cervical region: Secondary | ICD-10-CM

## 2019-04-01 DIAGNOSIS — E785 Hyperlipidemia, unspecified: Secondary | ICD-10-CM

## 2019-04-01 MED ORDER — GABAPENTIN 600 MG PO TAB
ORAL_TABLET | Freq: Three times a day (TID) | 3 refills | Status: AC
Start: 2019-04-01 — End: ?

## 2019-04-01 MED ORDER — LEVETIRACETAM 1,000 MG PO TAB
1500 mg | ORAL_TABLET | Freq: Two times a day (BID) | ORAL | 3 refills | 90.00000 days | Status: AC
Start: 2019-04-01 — End: 2019-10-09

## 2019-04-01 NOTE — Progress Notes
Date of Service: 04/01/2019     Subjective:               Darin [name] is a 58 y.o. male.        History of Present Illness    Pins-and-needles sensation has decreased with gabapentin.  It is less severe and happens once every few days.    Involuntary movements of the torso and legs continue.  They happen every day and are worse when he is active or walking.  They improved at rest.  He uses Keppra 1000 mg twice daily and gabapentin 600/600/900 without side effect.    He feels that strength has worsened in the right arm and both legs.  He has difficulty walking and drags the right leg.  He also noticed weakness and carrying objects with the right arm.    He has not yet gone to spinal cord injury rehab clinic.  He is scheduled November 2020.  He did not go because of COVID outbreak.    He saw Dr. Lisette Grinder who did not recommend surgical intervention.    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    He admits to periods of urinary urgency and occasional incontinence but no other bowel or bladder complaints.    Labs syphilis antibody was positive but a negative TP PA and RPR suggestive of false positive result, vitamin E normal, HIV negative, B12 46, folic acid normal, ESR 6, CMP benign, ALTs little high at 68 but AST normal, B6 and MMA normal, ANA negative    He denies new neurologic issues or concerns.       Review of Systems   Constitutional: Positive for activity change.   HENT: Positive for tinnitus.    Eyes: Negative.    Respiratory: Negative.    Cardiovascular: Negative.    Gastrointestinal: Negative.    Genitourinary: Positive for urgency.   Musculoskeletal: Positive for back pain, gait problem, myalgias, neck pain and neck stiffness.   Skin: Negative.    Allergic/Immunologic: Negative. Neurological: Positive for tremors, weakness and numbness.   Hematological: Negative.    Psychiatric/Behavioral: Negative.        Chief Complaint:  Chief Complaint   Patient presents with   ? Weakness     BUE-BLE        Past Medical History:  Medical History:   Diagnosis Date   ? Coronary artery disease    ? Degenerative disc disease, cervical    ? 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 Essential hypertension 09/14/2017   ? Hyperlipemia 09/14/2017   ? 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 Spinal cord abscess    ? Spinal stenosis        Surgical History:  Surgical History:   Procedure Laterality Date   ? BACLOFEN PUMP IMPLANTATION     ? BICEPS TENDON REPAIR     ? HX BACK SURGERY     ? HX TONSILLECTOMY     ? LAMINECTOMY     ? SPINAL FUSION     ? UMBILICAL ARTERIAL CATH - BEDSIDE         Social History:   Social History     Socioeconomic History   ? Marital status: Life Partner     Spouse name: Not on file   ?  Number of children: Not on file   ? Years of education: Not on file   ? Highest education level: Not on file   Occupational History   ? Not on file   Tobacco Use   ? Smoking status: Never Smoker   ? Smokeless tobacco: Never Used   ? Tobacco comment: nO HX   Substance and Sexual Activity   ? Alcohol use: No     Frequency: Never   ? Drug use: No     Comment: No HX   ? Sexual activity: Yes     Partners: Female     Birth control/protection: None   Other Topics Concern   ? Not on file   Social History Narrative   ? Not on file       Family History:  Family History   Problem Relation Age of Onset   ? Alzheimer's Mother    ? Coronary Artery Disease Father    ? Cancer Father    ? Heart Disease Brother    ? Diabetes Brother    ? Heart problem Brother         Triple bypass   ? None Reported Son    ? None Reported Maternal Grandmother    ? None Reported Maternal Grandfather    ? Cancer-Colon Paternal Grandmother    ? Heart Disease Paternal Grandfather    ? Heart Disease Brother ? Heart Attack Brother    ? Heart problem Brother    ? Cancer Brother        Allergies:  Allergies   Allergen Reactions   ? Morphine VOMITING       Objective:         ? acetaminophen (TYLENOL PO) Take  by mouth.   ? carisoprodoL (SOMA) 350 mg tablet Take 350 mg by mouth three times daily.   ? esomeprazole DR(+) (NEXIUM) 20 mg capsule Take 20 mg by mouth every morning. Take on an empty stomach at least 1 hour before or 2 hours after food.   ? gabapentin (NEURONTIN) 600 mg tablet Take 1 tab three times daily for 7d, then take 1 tab in AM, 1 tab in afternoon and 1 1/2 tab at bedtime   ? HYDROcodone/acetaminophen (NORCO) 5/325 mg tablet at bedtime daily     ? levETIRAcetam 1,000 mg tab Take one tablet by mouth twice daily.   ? naproxen (NAPROSYN) 500 mg tablet Take one tablet by mouth twice daily with meals. Indications: pain, For postoperative inflammation.     Vitals:    04/01/19 1227   BP: 137/87   BP Source: Arm, Right Upper   Patient Position: Sitting   Pulse: 82   Resp: 18   Temp: 36.4 ?C (97.6 ?F)   SpO2: 95%   Weight: 104.3 kg (230 lb)   Height: 172.7 cm (68)   PainSc: Eight     Body mass index is 34.97 kg/m?Marland Kitchen       Physical Exam    General: WG/WN, AAO x4, NAD  HEENT: NC/AT  Cardiac: RRR without murmur  Speech: fluent, no dysarthria or aphasia  CN: PERRL, EOMI, facial sensation? symmetric V1, V2 and V3 to light touch, No facial droop or ptosis  Motor: tone normal?in UEs without?rigidity or spasticity  Strength: 4/4+ SA/EF?4+/5-?EE/WE, 4/4?HF?4+/4+?KE, 4/4DF  Prominent action induced?involuntary clonic to myoclonic movements in legs and right arm, positive and?negative myoclonus  Leans to the right, nearly falls to right from myoclonus, no chorea, no dystonia  Sensation:patchy areas of  decreased light touch perception-no definitive sensory level   DTRs: 3/3?in BR/B, 3/3P, 2+/2+A, downgoing?plantar reflexes bilaterally  Coordination:?decreased gross and fine motor control of all extremities - worse on right than left  Gait:? Right hemiparetic gait, drags right leg narrow base, unsteady and ataxic, abnormal cadence, uses cane       Assessment and Plan:    1.  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  Chronic cervical spinal segmental myoclonus  3.  Generalized weakness likely due to spinal cord injury  4.  False positive syphilis antibody    Plan:  1.  Repeat MRI C/T-spine with/without gadolinium C/T-spine with/without gadolinium  2.  Increase Keppra to 1500 mg twice daily  3.  Continue gabapentin 600/600/900  4.  If myoclonus continues, consider clonazepam.  Maximize Keppra first.  5.  Spinal cord rehab clinic scheduled in November  6.  No other neurologic change now.  RTC 4 months or as needed sooner.  Reassured him about false positive syphilis antibody with a negative TP PA and RPR.

## 2019-04-18 ENCOUNTER — Encounter: Admit: 2019-04-18 | Discharge: 2019-04-18 | Payer: MEDICARE

## 2019-04-18 ENCOUNTER — Ambulatory Visit: Admit: 2019-04-18 | Discharge: 2019-04-18 | Payer: MEDICARE

## 2019-04-18 DIAGNOSIS — G959 Disease of spinal cord, unspecified: Secondary | ICD-10-CM

## 2019-04-18 DIAGNOSIS — G253 Myoclonus: Secondary | ICD-10-CM

## 2019-04-18 MED ORDER — GADOBENATE DIMEGLUMINE 529 MG/ML (0.1MMOL/0.2ML) IV SOLN
20 mL | Freq: Once | INTRAVENOUS | 0 refills | Status: CP
Start: 2019-04-18 — End: ?
  Administered 2019-04-18: 20:00:00 20 mL via INTRAVENOUS

## 2019-04-28 ENCOUNTER — Encounter: Admit: 2019-04-28 | Discharge: 2019-04-28 | Payer: MEDICARE

## 2019-05-02 ENCOUNTER — Encounter: Admit: 2019-05-02 | Discharge: 2019-05-02 | Payer: MEDICARE

## 2019-05-12 ENCOUNTER — Encounter: Admit: 2019-05-12 | Discharge: 2019-05-12 | Payer: MEDICARE

## 2019-05-13 ENCOUNTER — Encounter: Admit: 2019-05-13 | Discharge: 2019-05-13 | Payer: MEDICARE

## 2019-05-13 ENCOUNTER — Ambulatory Visit: Admit: 2019-05-13 | Discharge: 2019-05-13 | Payer: MEDICARE

## 2019-05-13 DIAGNOSIS — I2510 Atherosclerotic heart disease of native coronary artery without angina pectoris: Secondary | ICD-10-CM

## 2019-05-13 DIAGNOSIS — E785 Hyperlipidemia, unspecified: Secondary | ICD-10-CM

## 2019-05-13 DIAGNOSIS — G253 Myoclonus: Secondary | ICD-10-CM

## 2019-05-13 DIAGNOSIS — T148XXA Other injury of unspecified body region, initial encounter: Secondary | ICD-10-CM

## 2019-05-13 DIAGNOSIS — M5030 Other cervical disc degeneration, unspecified cervical region: Secondary | ICD-10-CM

## 2019-05-13 DIAGNOSIS — M5134 Other intervertebral disc degeneration, thoracic region: Secondary | ICD-10-CM

## 2019-05-13 DIAGNOSIS — M4800 Spinal stenosis, site unspecified: Secondary | ICD-10-CM

## 2019-05-13 DIAGNOSIS — G959 Disease of spinal cord, unspecified: Secondary | ICD-10-CM

## 2019-05-13 DIAGNOSIS — I10 Essential (primary) hypertension: Secondary | ICD-10-CM

## 2019-05-13 DIAGNOSIS — R269 Unspecified abnormalities of gait and mobility: Secondary | ICD-10-CM

## 2019-05-13 DIAGNOSIS — R252 Cramp and spasm: Secondary | ICD-10-CM

## 2019-05-13 DIAGNOSIS — G061 Intraspinal abscess and granuloma: Secondary | ICD-10-CM

## 2019-05-13 NOTE — Progress Notes
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  He has a very complex medical history multiple C-spine MRI imaging and myelograms ultimately demonstrating unchanged diffuse cervical cord myelomalacia with C4-C5 mild spinal stenosis with ventral cord flattening and mild right moderate left neuroforaminal stenosis.  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  The patient reported that he had diffuse weakness in his right upper extremity and bilateral lower extremities initially and was a primary wheelchair ambulator for several years.  He states that he is worked with physical therapy and Occupational Therapy several times, most recently specifically addressing mobility about 4 years ago.  Currently, the patient is able to ambulate short distances using a single-point cane in his left hand.  His primary complaint are myoclonic jerks that occur in his right upper extremity and bilateral lower extremity.  These typically occur when he actively or passively extends or flexes his right elbow, and when he extends or flexes his knees or ankles for example with changing position from sitting to standing or from sitting to lying down.  He has had to modify his gait in a way to wear he keeps his right lower extremity in extension and limits his leg abduction bilaterally.  This is left him with a slow, shuffling gait.  He does report falling frequently due to uncontrolled spasms which occur intermittently in his lower extremities.    Regarding previous spasticity management, the patient has had an intrathecal back from pump in the past with doses as high as 1400 mcg daily.  He states that while the intrathecal baclofen pump initially helped, it got to a point where he could not increase his dose safely and therefore the pump lost its efficacy in treating his spasms.  Before the intrathecal baclofen pump he had tried significant doses of baclofen tizanidine without significant benefit.    The patient states he has not yet trialed Botox injections.  At this point, the patient states he is willing to try anything to help control these myoclonic jerks.    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  He does report intermittent episodes of urinary frequency, though denies bladder accidents that occur spontaneously without control.    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    Neuropathic pain: The patient is currently taking gabapentin 600/600/900 which he states helps to control his neuropathic pain affecting his right greater than left hand, reported as pins-and-needles sensation.  He states that this, again, seems to be well controlled with his current dose of gabapentin.    Function: Again, the patient ambulates using a single-point cane for community distances and is occasionally able to ambulate without any assistance at home, though with decreased gait speed and stride length.         Medical History:   Diagnosis Date   ?  Coronary artery disease    ? Degenerative disc disease, cervical    ? 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 Essential hypertension 09/14/2017   ? Hyperlipemia 09/14/2017   ? 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 Spinal cord abscess    ? Spinal stenosis        Surgical History:   Procedure Laterality Date   ? BACLOFEN PUMP IMPLANTATION     ? BICEPS TENDON REPAIR     ? HX BACK SURGERY     ? HX TONSILLECTOMY     ? LAMINECTOMY     ? SPINAL FUSION     ? UMBILICAL ARTERIAL CATH - BEDSIDE         family history includes Alzheimer's in his mother; Cancer in his brother and father; Cancer-Colon in his paternal grandmother; Coronary Artery Disease in his father; Diabetes in his brother; Heart Attack in his brother; Heart Disease in his brother, brother, and paternal grandfather; Heart problem in his brother and brother; None Reported in his maternal grandfather, maternal grandmother, and son.    Social History     Socioeconomic History   ? Marital status: Life Partner     Spouse name: Not on file   ? Number of children: Not on file   ? Years of education: Not on file   ? Highest education level: Not on file   Occupational History   ? Not on file   Tobacco Use   ? Smoking status: Never Smoker   ? Smokeless tobacco: Never Used   ? Tobacco comment: nO HX   Substance and Sexual Activity   ? Alcohol use: No     Frequency: Never   ? Drug use: No     Comment: No HX   ? Sexual activity: Yes     Partners: Female     Birth control/protection: None   Other Topics Concern   ? Not on file   Social History Narrative   ? Not on file       Allergies   Allergen Reactions   ? Morphine VOMITING         Current Outpatient Medications:   ?  acetaminophen (TYLENOL PO), Take  by mouth., Disp: , Rfl:   ?  carisoprodoL (SOMA) 350 mg tablet, Take 350 mg by mouth three times daily., Disp: , Rfl:   ?  esomeprazole DR(+) (NEXIUM) 20 mg capsule, Take 20 mg by mouth every morning. Take on an empty stomach at least 1 hour before or 2 hours after food., Disp: , Rfl:   ?  gabapentin (NEURONTIN) 600 mg tablet, Take 1 tab three times daily for 7d, then take 1 tab in AM, 1 tab in afternoon and 1 1/2 tab at bedtime, Disp: 315 tablet, Rfl: 3 ?  HYDROcodone/acetaminophen (NORCO) 5/325 mg tablet, at bedtime daily  , Disp: , Rfl:   ?  Levetiracetam 1,000 mg tab, Take 1.5 tablets by mouth twice daily., Disp: 270 tablet, Rfl: 3  ?  naproxen (NAPROSYN) 500 mg tablet, Take one tablet by mouth twice daily with meals. Indications: pain, For postoperative inflammation., Disp: 30 tablet, Rfl: 2    Vitals:    05/13/19 1447   BP: (!) 161/96   BP Source: Arm, Right Upper   Patient Position: Sitting   Pulse: 60   Resp: 16   Temp: 36.4 ?C (97.5 ?F)   TempSrc: Skin   SpO2: 97%   PainSc: Nine  Oswestry Total Score:: 74        Pain Score: Nine    There is no height or weight on file to calculate BMI.    Review of Systems   Constitutional: Positive for activity change.   Genitourinary: Positive for frequency.   Musculoskeletal: Positive for back pain, gait problem, neck pain and neck stiffness.   Neurological: Positive for weakness and numbness.   All other systems reviewed and are negative.           PHYSICAL EXAM:    Gen: Alert & Oriented X 3, No Acute Distress  HEENT: NCAT, PERRL, EOMI, MMM  Neck: Supple, no elevated JVP  Heart: Regular Rate & Rhythm  Lungs: Good inspiratory effort   Abdomen: Soft, non-tender, non-distended  Skin: Warm, dry   Ext: No edema   MS  Patient has a very interesting neurological exam:, His strength is relatively full in all extremities with exception of weak right elbow extension (4/5).  Range of motion is very limited by significant myoclonic jerking in his right upper extremity and right greater than left lower extremities with any significant knee, ankle, elbow, wrist passive or active range of motion.    There is sustained clonus on the right ankle, however this is also limited by significant myoclonic jerking.    Otherwise, DTRs are 3+ and symmetrical.  Sensation is reportedly diminished to light touch over all extremities with reported allodynia over his left hand.    Gait assessment: Patient has a shuffling gait with a right lower extremity that remains in knee extension with mild right foot external rotation.  The patient has a significantly decreased stride but does demonstrate normal heel strike of his left foot.  At times in his gait, he approaches a normal stride and cadence.  No abnormal myoclonic jerking was noted during ambulation, however significant myoclonic jerking in his bilateral lower extremities was noted in changing position from sitting to standing and vice versa.    His clonic jerks are difficult to resist given the patient's full strength and are not readily broken with extending the affected muscle.      IMPRESSION:    1. Cervical myelopathy (HCC)    2. Action induced myoclonus    3. Gait abnormality          PLAN:   The patient has a very unusual presentation specifically in the setting of a cervical nontraumatic spinal cord injury.  I agree with continued treatment of the patient's neuropathic pain with gabapentin as this seems to be helping very well.  Regarding the patient spasticity, I do long discussion with the patient regarding the potential efficacy of Botox injections, though I do not feel that this will dramatically change his myoclonic jerking, it might provide some decreased response specifically with plantar flexion with myoclonic jerking of his gastrocs and knee flexion with myoclonic jerking in his bilateral hamstrings.  I would be happy to trial Botox injections to both of those muscle groups bilaterally and assess for efficacy.  Patient would like to research into both Botox injections more thoroughly prior to making a decision at this point.  I am agreeable to this plan.  Otherwise, as the patient has tried and failed several antispasmodic medications and even an intrathecal baclofen pump at significantly high doses, I do not feel that there are any great medication options at this time that would treat this abnormality.  We discussed enrolling back in physical therapy specifically for range of motion exercises and  modalities to help reduce myoclonus however, the patient does not feel this would be a viable option given he has tried this several times without significant efficacy.    Overall the patient does feel he is able to maintain his functional independence in the community and denies any significant decrease in his ability to mobilize functionally over the last several years.  I will go ahead and make an appointment for the above-mentioned Botox injections in 1 month.  The patient may call to cancel this appointment based on his decision to proceed.

## 2019-05-13 NOTE — Patient Instructions
It was a pleasure seeing you in clinic today.    Rick Shundra Wirsing RN, BSN  Clinical Nurse Coordinator  Physical Medicine & Rehab  Dr. Jason Frederick  [facility]  [facility]  [address]  [place], [place] [address]    Phone: [phone]  Fax [phone]  For scheduling, cancelling, or changing appointments [phone]    For prescription refills, please contact your pharmacy.  Please allow 2-3 days for refill requests to be filled.      For information on spinal conditions, please visit [email or web address]

## 2019-05-19 ENCOUNTER — Encounter: Admit: 2019-05-19 | Discharge: 2019-05-19 | Payer: MEDICARE

## 2019-06-05 ENCOUNTER — Encounter: Admit: 2019-06-05 | Discharge: 2019-06-05 | Payer: MEDICARE

## 2019-06-17 ENCOUNTER — Ambulatory Visit: Admit: 2019-06-17 | Discharge: 2019-06-18 | Payer: MEDICARE

## 2019-06-17 DIAGNOSIS — R269 Unspecified abnormalities of gait and mobility: Secondary | ICD-10-CM

## 2019-06-17 DIAGNOSIS — G959 Disease of spinal cord, unspecified: Secondary | ICD-10-CM

## 2019-06-17 DIAGNOSIS — R252 Cramp and spasm: Secondary | ICD-10-CM

## 2019-06-17 NOTE — Progress Notes
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  At her last visit we discussed options, which unfortunately very limited given his history of multiple failed antispasmodic medications including an intrathecal back from pump.  Discussed Botox injections which she is currently not interested in due to fear of worsening his gait.  Unfortunately, the patient has become much less mobile with the cold weather and during the pandemic.  He is now having near falls and sometimes significant falls ambulating short distances within his home due to the spasms in his lower extremities.  He is interested in getting a manual or power wheelchair to help him with home and create distance mobility.    Otherwise, the patient denies any changes in strength/sensation, bowel or bladder management.       Review of Systems   Constitutional: Positive for fatigue.   Musculoskeletal: Positive for back pain, neck pain and neck stiffness.   Neurological: Positive for headaches.   Hematological: Bruises/bleeds easily.   All other systems reviewed and are negative.      Current Outpatient Medications:   ?  acetaminophen (TYLENOL PO), Take  by mouth., Disp: , Rfl:   ?  carisoprodoL (SOMA) 350 mg tablet, Take 350 mg by mouth three times daily., Disp: , Rfl: ?  esomeprazole DR(+) (NEXIUM) 20 mg capsule, Take 20 mg by mouth every morning. Take on an empty stomach at least 1 hour before or 2 hours after food., Disp: , Rfl:   ?  gabapentin (NEURONTIN) 600 mg tablet, Take 1 tab three times daily for 7d, then take 1 tab in AM, 1 tab in afternoon and 1 1/2 tab at bedtime, Disp: 315 tablet, Rfl: 3  ?  HYDROcodone/acetaminophen (NORCO) 5/325 mg tablet, at bedtime daily  , Disp: , Rfl:   ?  Levetiracetam 1,000 mg tab, Take 1.5 tablets by mouth twice daily., Disp: 270 tablet, Rfl: 3  ?  naproxen (NAPROSYN) 500 mg tablet, Take one tablet by mouth twice daily with meals. Indications: pain, For postoperative inflammation., Disp: 30 tablet, Rfl: 2  Allergies   Allergen Reactions   ? Morphine VOMITING     Physical Exam  There were no vitals filed for this visit.  Oswestry Total Score:: 66     There is no height or weight on file to calculate BMI.    Physical exam was performed by evaluating the patient via telehealth.  This included asking the patient to perform certain maneuvers/motions in evaluating these visually.  Gen: Alert & Oriented X 3, No Acute Distress  HEENT: NCAT, PERRL, EOMI, MMM  Lungs: Good inspiratory effort   Skin: No visualized lesions or skin discolorations.   Ext: No edema visualized  MS:  Active range of motion is full and at least antigravity to left upper extremity             IMPRESSION:  1. Cervical myelopathy (HCC)    2. Spasticity    3. Gait abnormality PLAN: We discussed  Botox as an option for potentially treating his bilateral lower extremity myoclonus and he would like to continue to defer this option at this time.  I agree with proceeding with a wheelchair to assist with mobility given his history of falls for home and commute distance mobility.  We discussed options regarding a manual chair versus a power chair.  Given the patient's right upper extremity weakness and myoclonus affecting his right upper extremity and bilateral lower extremities, power chair may be more appropriate, though the patient does endorse that at times he can use his right upper extremity to assist with mobility.  The patient has a scheduled wheelchair seating clinic visit in January of next year and these concerns can be addressed at that time.  I will follow-up with the patient in 6 months as well.

## 2019-06-17 NOTE — Patient Instructions
It was a pleasure seeing you in clinic today.    Rick Meganne Rita RN, BSN  Clinical Nurse Coordinator  Physical Medicine & Rehab  Dr. Jason Frederick  [facility]  [facility]  [address]  [place], [place] [address]    Phone: [phone]  Fax [phone]  For scheduling, cancelling, or changing appointments [phone]    For prescription refills, please contact your pharmacy.  Please allow 2-3 days for refill requests to be filled.      For information on spinal conditions, please visit [email or web address]

## 2019-06-19 ENCOUNTER — Encounter: Admit: 2019-06-19 | Discharge: 2019-06-19 | Payer: MEDICARE

## 2019-08-05 ENCOUNTER — Encounter: Admit: 2019-08-05 | Discharge: 2019-08-05 | Payer: MEDICARE

## 2019-09-11 ENCOUNTER — Encounter: Admit: 2019-09-11 | Discharge: 2019-09-11 | Payer: MEDICARE

## 2019-09-11 NOTE — Telephone Encounter
Called pt and let him know we missed him at 2/2 appt that he did not show for. Asked him to call scheduling line get follow up appt scheduled. Provided number.

## 2019-09-16 ENCOUNTER — Encounter: Admit: 2019-09-16 | Discharge: 2019-09-16 | Payer: MEDICARE

## 2019-09-26 ENCOUNTER — Encounter: Admit: 2019-09-26 | Discharge: 2019-09-26 | Payer: MEDICARE

## 2019-10-08 ENCOUNTER — Encounter: Admit: 2019-10-08 | Discharge: 2019-10-08 | Payer: MEDICARE

## 2019-10-09 ENCOUNTER — Encounter: Admit: 2019-10-09 | Discharge: 2019-10-09 | Payer: MEDICARE

## 2019-10-09 ENCOUNTER — Ambulatory Visit: Admit: 2019-10-09 | Discharge: 2019-10-09 | Payer: MEDICARE

## 2019-10-09 MED ORDER — CLONAZEPAM 0.5 MG PO TAB
ORAL_TABLET | Freq: Two times a day (BID) | 5 refills | Status: DC
Start: 2019-10-09 — End: 2019-10-13
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
## 2019-10-12 ENCOUNTER — Encounter: Admit: 2019-10-12 | Discharge: 2019-10-12 | Payer: MEDICARE

## 2019-10-13 ENCOUNTER — Encounter: Admit: 2019-10-13 | Discharge: 2019-10-13 | Payer: MEDICARE

## 2019-10-16 ENCOUNTER — Encounter: Admit: 2019-10-16 | Discharge: 2019-10-16 | Payer: MEDICARE

## 2019-11-03 ENCOUNTER — Encounter: Admit: 2019-11-03 | Discharge: 2019-11-03 | Payer: MEDICARE

## 2019-11-07 ENCOUNTER — Encounter: Admit: 2019-11-07 | Discharge: 2019-11-07 | Payer: MEDICARE

## 2019-12-08 ENCOUNTER — Encounter: Admit: 2019-12-08 | Discharge: 2019-12-08 | Payer: MEDICARE

## 2019-12-08 DIAGNOSIS — R29898 Other symptoms and signs involving the musculoskeletal system: Secondary | ICD-10-CM

## 2019-12-08 DIAGNOSIS — M5412 Radiculopathy, cervical region: Secondary | ICD-10-CM

## 2019-12-08 DIAGNOSIS — R200 Anesthesia of skin: Secondary | ICD-10-CM

## 2019-12-09 ENCOUNTER — Encounter: Admit: 2019-12-09 | Discharge: 2019-12-09 | Payer: MEDICARE

## 2019-12-09 ENCOUNTER — Ambulatory Visit: Admit: 2019-12-09 | Discharge: 2019-12-09 | Payer: MEDICARE

## 2019-12-09 DIAGNOSIS — M5412 Radiculopathy, cervical region: Secondary | ICD-10-CM

## 2019-12-09 DIAGNOSIS — M4800 Spinal stenosis, site unspecified: Secondary | ICD-10-CM

## 2019-12-09 DIAGNOSIS — S14109D Unspecified injury at unspecified level of cervical spinal cord, subsequent encounter: Secondary | ICD-10-CM

## 2019-12-09 DIAGNOSIS — I10 Essential (primary) hypertension: Secondary | ICD-10-CM

## 2019-12-09 DIAGNOSIS — I2510 Atherosclerotic heart disease of native coronary artery without angina pectoris: Secondary | ICD-10-CM

## 2019-12-09 DIAGNOSIS — R29898 Other symptoms and signs involving the musculoskeletal system: Secondary | ICD-10-CM

## 2019-12-09 DIAGNOSIS — R200 Anesthesia of skin: Secondary | ICD-10-CM

## 2019-12-09 DIAGNOSIS — G061 Intraspinal abscess and granuloma: Secondary | ICD-10-CM

## 2019-12-09 DIAGNOSIS — M5030 Other cervical disc degeneration, unspecified cervical region: Secondary | ICD-10-CM

## 2019-12-09 DIAGNOSIS — E785 Hyperlipidemia, unspecified: Secondary | ICD-10-CM

## 2019-12-09 DIAGNOSIS — R258 Other abnormal involuntary movements: Secondary | ICD-10-CM

## 2019-12-09 DIAGNOSIS — S14109S Unspecified injury at unspecified level of cervical spinal cord, sequela: Secondary | ICD-10-CM

## 2019-12-09 DIAGNOSIS — T148XXA Other injury of unspecified body region, initial encounter: Secondary | ICD-10-CM

## 2019-12-09 DIAGNOSIS — R2689 Other abnormalities of gait and mobility: Secondary | ICD-10-CM

## 2019-12-09 DIAGNOSIS — G959 Disease of spinal cord, unspecified: Secondary | ICD-10-CM

## 2019-12-09 DIAGNOSIS — M5134 Other intervertebral disc degeneration, thoracic region: Secondary | ICD-10-CM

## 2019-12-09 NOTE — Patient Instructions
It was a pleasure seeing you in clinic today.  Please don't hesitate to call if you have any questions.      Drue Camera BSN, RN, CNOR  Clinical Nurse Coordinator  Dr. Brandon Carlson  [facility]  [facility]  [address]  [place], [place] [address]  [email or web address]  Phone: [phone]  Fax:  [phone]  Scheduling [phone]

## 2019-12-09 NOTE — Progress Notes
Darin A. Clydene Pugh, MD [facility]  Follow - Up Visit  Subjective     REASON FOR VISIT   Generalized Joint/Body Pain of the Spine and Pain (all over body pain )    SUBJECTIVE     Darin [name] present for a repeat evaluation as the weakness in his right arm has gotten worse over the last year. He is no longer able to hold a bag of groceries with his right hand. He is now in a wheelchair mostly when out of the house. He does walk with a cane and furniture walks around his house. He has significant myoclonus with any active activity to his right upper extremity.  He has painful popping of his neck, especially at night when he turns his head.  He right arm is also more numb than it used to be from his shoulder down to his whole hand. He has an achy pain from his shoulder into his hand as well. His hand also feels like pins and needles. His last MRI of the C spine is from October 2020.           ROS: Review of Systems  A 10-point ROS was performed and negative.    PHYSICAL EXAM   Blood pressure (!) 152/96, pulse 78, temperature 36.8 ?C (98.3 ?F), height 172.7 cm (67.99), weight 102.1 kg (225 lb), SpO2 95 %.  Body mass index is 34.22 kg/m?Marland Kitchen  Oswestry Total Score:: 68  Pain Score: Eight    Constitutional: Alert, NAD  Head: Atraumatic  Eyes: EOMI  Respiratory: Unlabored breathing  Cardiovascular: Regular rate  Skin: No rashes or open wounds appreciated on back  Musculoskeletal: Strength stable  Neurologic: Sensation stable    MOTOR:  Upper Ext. Deltoid Triceps Biceps Wrist Ext Wrist Flex Grip Intrinsics   Right Spasms/ clonus Spasms/ clonus Spasms/ clonus Spasms/ clonus Spasms/ clonus Spasms/ clonus Spasms/ clonus   Left 5 5 5 5 5 5 5    His upper extremity exam is extremely limited due to this clonus activity in his right upper extremity with any intentional movement or muscle firing.  Even with limited muscle firing the clonus begins and has a very aggressive ramp up in its nature.  It seems to cause his entire arm and leg to shake substantially.  It made a full exam of his muscle strength very difficult.  ?      RADIOGRAPHS   EOS scoliosis films ordered and reviewed from today: He has 12 rib-bearing thoracic and 5 nonrib-bearing lumbar-type vertebral bodies identified. No measurable scoliosis. Interbody fusion C3-C4 and C4-5 without significant change since prior exam. Minimal multilevel degenerative changes disc disease in the midthoracic region. Grade 1 anterolisthesis of L4 on L5 with likely bilateral L5 pars defects. No compression fracture.     MRI from 04/18/2019:  1. ?Prior Low Profile fusion at C3-4 and C4-5 and T1-T2 laminectomies   without abnormal enhancement or fluid collection to suggest   superinfection.   2. ?T2 hyperintensity and dorsal column cord volume loss from C4 through   C6 with concomitant loss of the dorsal epidural CSF space and thin linear   dural T2 hyperintensity at C5-6. 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 A superimposed component of chronic adhesive arachnoiditis   could also account for these findings.   3. ?Central disc protrusion at T5-T6 resulting in at least mild central   spinal stenosis. Focal ill-defined T2  cord hyperintensity cephalad to this   disc herniation at T5 as well as concomitant upper thoracic cord marginal   irregularity. Constellation of findings presumably related to gliotic   sequelae of prior trauma, infection, or inflammation with a component of   adhesive arachnoiditis not excluded.   4. ?Multilevel degenerative cervical central spinal and foraminal stenosis   from C3-4 through C6-7 as detailed.      ASSESSMENT / PLAN     Darin [name] is a 59 y.o. male with     1. Hand weakness  MRI C-SPINE WO CONTRAST   2. Cervical radiculopathy     3. Hand numbness     4. 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 Cervical myelopathy (HCC)     6. 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 Clonus     8. Balance problems         Progressive weakness and numbness discussed with patient. Exam difficult due to severe myoclonus, he certainly describes and gives specific examples of right arm weakness, with not being able to hold a bag of groceries or lift things he used to be able to.  The numbness distribution has also progressed since he was last seen. I recommend an update MRI of the c spine. He has a history of C3-4, C4-5 fusion. His xrays appear stable from a cervical spine standpoint. I would like to see him back after the MRI is completed to make recommendations.       Malachy Moan, APRN, FNP-C   Nurse Practitioner-Spine Surgery    NP to Darin B. Lisette Grinder, MD, MPH  Spinal Surgery  Darin Beach. Clydene Pugh MD, [facility]  Nurse: Darin [name], BSN, RN, CNOR   [phone]  -  [email or web address]

## 2019-12-23 ENCOUNTER — Ambulatory Visit: Admit: 2019-12-23 | Discharge: 2019-12-23 | Payer: MEDICARE

## 2019-12-23 ENCOUNTER — Encounter: Admit: 2019-12-23 | Discharge: 2019-12-23 | Payer: MEDICARE

## 2019-12-23 DIAGNOSIS — R29898 Other symptoms and signs involving the musculoskeletal system: Secondary | ICD-10-CM

## 2019-12-25 ENCOUNTER — Encounter: Admit: 2019-12-25 | Discharge: 2019-12-25 | Payer: MEDICARE

## 2019-12-28 ENCOUNTER — Encounter: Admit: 2019-12-28 | Discharge: 2019-12-28 | Payer: MEDICARE

## 2019-12-29 ENCOUNTER — Ambulatory Visit: Admit: 2019-12-29 | Discharge: 2019-12-29 | Payer: MEDICARE

## 2019-12-29 ENCOUNTER — Encounter: Admit: 2019-12-29 | Discharge: 2019-12-29 | Payer: MEDICARE

## 2019-12-29 MED ORDER — RXAMB AMITR/GABAPEN/EMU OIL 4/4/10% CREAM (COMPOUND)
Freq: Four times a day (QID) | TOPICAL | 11 refills | Status: AC | PRN
Start: 2019-12-29 — End: ?

## 2019-12-30 ENCOUNTER — Ambulatory Visit: Admit: 2019-12-30 | Discharge: 2019-12-30 | Payer: MEDICARE

## 2019-12-30 ENCOUNTER — Encounter: Admit: 2019-12-30 | Discharge: 2019-12-30 | Payer: MEDICARE

## 2019-12-30 DIAGNOSIS — M2550 Pain in unspecified joint: Secondary | ICD-10-CM

## 2019-12-30 DIAGNOSIS — R258 Other abnormal involuntary movements: Secondary | ICD-10-CM

## 2019-12-30 DIAGNOSIS — G959 Disease of spinal cord, unspecified: Secondary | ICD-10-CM

## 2019-12-30 DIAGNOSIS — M5030 Other cervical disc degeneration, unspecified cervical region: Secondary | ICD-10-CM

## 2019-12-30 DIAGNOSIS — T148XXA Other injury of unspecified body region, initial encounter: Secondary | ICD-10-CM

## 2019-12-30 DIAGNOSIS — M4800 Spinal stenosis, site unspecified: Secondary | ICD-10-CM

## 2019-12-30 DIAGNOSIS — S14109S Unspecified injury at unspecified level of cervical spinal cord, sequela: Secondary | ICD-10-CM

## 2019-12-30 DIAGNOSIS — M5134 Other intervertebral disc degeneration, thoracic region: Secondary | ICD-10-CM

## 2019-12-30 DIAGNOSIS — R2689 Other abnormalities of gait and mobility: Secondary | ICD-10-CM

## 2019-12-30 DIAGNOSIS — I10 Essential (primary) hypertension: Secondary | ICD-10-CM

## 2019-12-30 DIAGNOSIS — R200 Anesthesia of skin: Secondary | ICD-10-CM

## 2019-12-30 DIAGNOSIS — R29898 Other symptoms and signs involving the musculoskeletal system: Secondary | ICD-10-CM

## 2019-12-30 DIAGNOSIS — M5412 Radiculopathy, cervical region: Secondary | ICD-10-CM

## 2019-12-30 DIAGNOSIS — G061 Intraspinal abscess and granuloma: Secondary | ICD-10-CM

## 2019-12-30 DIAGNOSIS — S14109D Unspecified injury at unspecified level of cervical spinal cord, subsequent encounter: Secondary | ICD-10-CM

## 2019-12-30 DIAGNOSIS — E785 Hyperlipidemia, unspecified: Secondary | ICD-10-CM

## 2019-12-30 DIAGNOSIS — I2510 Atherosclerotic heart disease of native coronary artery without angina pectoris: Secondary | ICD-10-CM
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
## 2019-12-31 ENCOUNTER — Encounter: Admit: 2019-12-31 | Discharge: 2019-12-31 | Payer: MEDICARE

## 2020-01-01 ENCOUNTER — Encounter: Admit: 2020-01-01 | Discharge: 2020-01-01 | Payer: MEDICARE

## 2020-01-01 MED FILL — RXAMB AMITR/GABAPEN/EMU OIL 4/4/10% CREAM (COMPOUND): TOPICAL | 30 days supply | Qty: 100 | Fill #1 | Status: AC

## 2020-02-02 ENCOUNTER — Encounter: Admit: 2020-02-02 | Discharge: 2020-02-02 | Payer: MEDICARE

## 2020-02-03 ENCOUNTER — Ambulatory Visit: Admit: 2020-02-03 | Discharge: 2020-02-03 | Payer: MEDICARE

## 2020-02-03 ENCOUNTER — Encounter: Admit: 2020-02-03 | Discharge: 2020-02-03 | Payer: MEDICARE

## 2020-02-03 DIAGNOSIS — R2689 Other abnormalities of gait and mobility: Secondary | ICD-10-CM

## 2020-02-03 DIAGNOSIS — I2510 Atherosclerotic heart disease of native coronary artery without angina pectoris: Secondary | ICD-10-CM

## 2020-02-03 DIAGNOSIS — I10 Essential (primary) hypertension: Secondary | ICD-10-CM

## 2020-02-03 DIAGNOSIS — M5134 Other intervertebral disc degeneration, thoracic region: Secondary | ICD-10-CM

## 2020-02-03 DIAGNOSIS — G959 Disease of spinal cord, unspecified: Secondary | ICD-10-CM

## 2020-02-03 DIAGNOSIS — E785 Hyperlipidemia, unspecified: Secondary | ICD-10-CM

## 2020-02-03 DIAGNOSIS — M5030 Other cervical disc degeneration, unspecified cervical region: Secondary | ICD-10-CM

## 2020-02-03 DIAGNOSIS — R29898 Other symptoms and signs involving the musculoskeletal system: Secondary | ICD-10-CM

## 2020-02-03 DIAGNOSIS — M4800 Spinal stenosis, site unspecified: Secondary | ICD-10-CM

## 2020-02-03 DIAGNOSIS — M2550 Pain in unspecified joint: Secondary | ICD-10-CM

## 2020-02-03 DIAGNOSIS — R200 Anesthesia of skin: Secondary | ICD-10-CM

## 2020-02-03 DIAGNOSIS — M5412 Radiculopathy, cervical region: Secondary | ICD-10-CM

## 2020-02-03 DIAGNOSIS — G061 Intraspinal abscess and granuloma: Secondary | ICD-10-CM

## 2020-02-03 DIAGNOSIS — R258 Other abnormal involuntary movements: Secondary | ICD-10-CM

## 2020-02-03 DIAGNOSIS — S14109S Unspecified injury at unspecified level of cervical spinal cord, sequela: Secondary | ICD-10-CM

## 2020-02-03 DIAGNOSIS — T148XXA Other injury of unspecified body region, initial encounter: Secondary | ICD-10-CM

## 2020-02-03 DIAGNOSIS — S14109D Unspecified injury at unspecified level of cervical spinal cord, subsequent encounter: Secondary | ICD-10-CM

## 2020-02-03 MED ORDER — LIDOCAINE (PF) 10 MG/ML (1 %) IJ SOLN
2 mL | Freq: Once | INTRAMUSCULAR | 0 refills | Status: CP
Start: 2020-02-03 — End: ?

## 2020-02-03 MED ORDER — IOHEXOL 300 MG IODINE/ML IV SOLN
2 mL | Freq: Once | 0 refills | Status: CP
Start: 2020-02-03 — End: ?

## 2020-02-03 MED ORDER — DEXAMETHASONE SODIUM PHOS (PF) 10 MG/ML IJ SOLN
10 mg | Freq: Once | 0 refills | Status: CP
Start: 2020-02-03 — End: ?

## 2020-02-03 NOTE — Discharge Instructions - Supplementary Instructions
GENERAL POST PROCEDURE INSTRUCTIONS  Physician: _________________________________  Procedure Completed Today:  o Joint Injection (hip, knee, shoulder)  o Cervical Epidural Steroid Injection  o Cervical Transforaminal Steroid Injection  o Trigger Point Injection  o Caudal Epidural Steroid Injection  o Pudendal Nerve Block  o Other _____________________ o Thoracic Epidural Steroid Injection  o Lumbar Epidural Steroid Injection  o Lumbar Transforaminal Steroid Injection  o Facet Joint Injection  o Celiac Nerve Block  o Sacrococcygeal  o Sacroiliac Joint Injection   Important information following your procedure today:  o You may drive today     o If you had sedation, you may NOT drive today  - Rest at home for the next 6 hours.  You may then begin to resume your normal activities.  - DO NOT drive any vehicle, operate any power tools, drink alcohol, make any major decisions, or sign any legal documents for the next 12 hours.  1. Pain relief may not be immediate. It is possible you may even experience an increase in pain during the first 24-48 hours followed by a gradual decrease of your pain.  2. Though the procedure is generally safe, and complications are rare, we do ask that you be aware of any of the following:  ? Any swelling, persistent redness, new bleeding or drainage from the site of the injection.  ? You should not experience a severe headache.  ? You should not run a fever over 101oF.  ? New onset of sharp, severe back and or neck pain.  ? New onset of upper or lower extremity numbness or weakness.  ? New difficulty controlling bowel or bladder function after injection.  ? New shortness of breath.  ** If any of these occur, please call to report this occurrence to a nurse at [phone]. If you are calling after 4:00 p.m. or on weekends or holidays, please call [phone] and ask to have the resident physician on call for the physician paged or go to your local emergency room.  3. You may experience soreness at the injection site. Ice can be applied at 20-minute intervals for the first 24 hours. The following day you may alternate ice with heat if you are experiencing muscle tightness, otherwise continue with ice. Ice works best at decreasing pain. Avoid application of direct heat, hot showers or hot tubs today.  4. Avoid strenuous activity today. You many resume your regular activities and exercise tomorrow.  5. Patients with diabetes may see an elevation in blood sugars for 7-10 days after the injection. It is important to pay close attention to your diet, check your blood sugars daily and report extreme elevations to the physician that manages your diabetes.  6. Patients taking daily blood thinners can resume their regular dose this evening.  7. It is important that you take all medications ordered by your pain physician. Taking medications as ordered is an important part of your pain care plan. If you cannot continue the medication plan, please notify the physician.    Possible side effects to steroids that may occur:  ? Flushing or redness of the face  ? Irritability  ? Fluid retention  ? Change in women's menses  ? Minor headache    If you are unable to keep your upcoming appointment, please notify the [facility] scheduler at [phone] at least 24 hours in advance. If you have questions for the surgery center, call [facility] at [phone].

## 2020-02-04 ENCOUNTER — Encounter: Admit: 2020-02-04 | Discharge: 2020-02-04 | Payer: MEDICARE

## 2020-02-10 ENCOUNTER — Encounter: Admit: 2020-02-10 | Discharge: 2020-02-10 | Payer: MEDICARE

## 2020-03-09 ENCOUNTER — Encounter: Admit: 2020-03-09 | Discharge: 2020-03-09 | Payer: MEDICARE

## 2020-03-09 ENCOUNTER — Ambulatory Visit: Admit: 2020-03-09 | Discharge: 2020-03-09 | Payer: MEDICARE

## 2020-03-09 DIAGNOSIS — M2550 Pain in unspecified joint: Secondary | ICD-10-CM

## 2020-03-09 DIAGNOSIS — M4800 Spinal stenosis, site unspecified: Secondary | ICD-10-CM

## 2020-03-09 DIAGNOSIS — M5412 Radiculopathy, cervical region: Secondary | ICD-10-CM

## 2020-03-09 DIAGNOSIS — G959 Disease of spinal cord, unspecified: Secondary | ICD-10-CM

## 2020-03-09 DIAGNOSIS — G061 Intraspinal abscess and granuloma: Secondary | ICD-10-CM

## 2020-03-09 DIAGNOSIS — S14109S Unspecified injury at unspecified level of cervical spinal cord, sequela: Secondary | ICD-10-CM

## 2020-03-09 DIAGNOSIS — M5134 Other intervertebral disc degeneration, thoracic region: Secondary | ICD-10-CM

## 2020-03-09 DIAGNOSIS — R2689 Other abnormalities of gait and mobility: Secondary | ICD-10-CM

## 2020-03-09 DIAGNOSIS — R200 Anesthesia of skin: Secondary | ICD-10-CM

## 2020-03-09 DIAGNOSIS — I10 Essential (primary) hypertension: Secondary | ICD-10-CM

## 2020-03-09 DIAGNOSIS — R29898 Other symptoms and signs involving the musculoskeletal system: Secondary | ICD-10-CM

## 2020-03-09 DIAGNOSIS — M5030 Other cervical disc degeneration, unspecified cervical region: Secondary | ICD-10-CM

## 2020-03-09 DIAGNOSIS — I2510 Atherosclerotic heart disease of native coronary artery without angina pectoris: Secondary | ICD-10-CM

## 2020-03-09 DIAGNOSIS — S14109D Unspecified injury at unspecified level of cervical spinal cord, subsequent encounter: Secondary | ICD-10-CM

## 2020-03-09 DIAGNOSIS — T148XXA Other injury of unspecified body region, initial encounter: Secondary | ICD-10-CM

## 2020-03-09 DIAGNOSIS — R258 Other abnormal involuntary movements: Secondary | ICD-10-CM

## 2020-03-09 DIAGNOSIS — E785 Hyperlipidemia, unspecified: Secondary | ICD-10-CM

## 2020-03-09 NOTE — Patient Instructions
It was a pleasure seeing you in clinic today.  Please don't hesitate to call if you have any questions.      Emanuela Runnion BSN, RN, CNOR  Clinical Nurse Coordinator  Dr. Brandon Carlson  [facility]  [facility]  [address]  [place], [place] [address]  [email or web address]  Phone: [phone]  Fax:  [phone]  Scheduling [phone]
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
## 2020-03-19 ENCOUNTER — Encounter: Admit: 2020-03-19 | Discharge: 2020-03-19 | Payer: MEDICARE

## 2020-03-19 DIAGNOSIS — Z419 Encounter for procedure for purposes other than remedying health state, unspecified: Secondary | ICD-10-CM

## 2020-03-19 DIAGNOSIS — R200 Anesthesia of skin: Secondary | ICD-10-CM

## 2020-03-19 DIAGNOSIS — R2689 Other abnormalities of gait and mobility: Secondary | ICD-10-CM

## 2020-03-19 DIAGNOSIS — G959 Disease of spinal cord, unspecified: Secondary | ICD-10-CM

## 2020-03-19 DIAGNOSIS — Z20822 Encounter for screening laboratory testing for COVID-19 virus in asymptomatic patient: Secondary | ICD-10-CM

## 2020-03-19 DIAGNOSIS — M5412 Radiculopathy, cervical region: Secondary | ICD-10-CM

## 2020-03-19 DIAGNOSIS — S14109D Unspecified injury at unspecified level of cervical spinal cord, subsequent encounter: Secondary | ICD-10-CM

## 2020-03-19 DIAGNOSIS — R29898 Other symptoms and signs involving the musculoskeletal system: Secondary | ICD-10-CM

## 2020-03-22 ENCOUNTER — Encounter: Admit: 2020-03-22 | Discharge: 2020-03-22 | Payer: MEDICARE

## 2020-04-06 NOTE — Patient Instructions
Your tentative surgery date is May 14, 2020    Your surgery is Anterior Cervical Discectomy and Fusion Cervical 5-6, Possible Cage Placement Cervical 5-6, Possible Iliac Crest Bone Graft    Surgery will be at [facility] with Dr. Greer Pickerel  [address], [place], [place] [address]     You will check in at the information desk the day of your surgery.    The information desk is located in the lobby at the hospital entrance.     You will receive a call from the chart prep team the day prior to your surgery after 2 pm, to inform you of what time you will need to arrive ito the hospital the day of your surgery.  If you have not been contacted by 4:30 pm the day prior your surgery date, please call [phone].  Tell them you are scheduled for surgery on (date) and what time you need to be at the hospital.     [facility] they will instruct you on what medications to take on the morning of surgery.  If unsure, please contact PAC to clarify prior to surgery date.    CHG (Chlorhexidine Gluconate) an antimicrobial scrub, shower twice prior to surgery-night before and morning of surgery.  You will receive this at your [facility] appointment.    Notify the Pre-post Unit at [phone] if you need to cancel your procedure the day of your surgery or if you are going to be late. If you need to cancel your surgery before the day of your surgery, please call our office at [phone].    We will have you up and walking after of your surgery. But please don't walk without assistance, even if it's just to use the bathroom. Please utilize your call light.     Dressing and incision care will be addressed when you're discharged from the hospital.  Keep incision dry.  Do not apply lotions, creams, or ointments to incision site.   Change dressing every other day or sooner if needed.  If your dressing becomes saturated with blood or puss, please contact our office. If you have sutures, they will be removed at your first postoperative visit.    You may shower 3 days after your surgery. Do not have the shower stream run over your incision site. Please wear your dressing in the shower, covered by something that is water resistant. Once you are done showering, change your dressing. Your incision must stay dry after surgery to heal properly. DO NOT submerge or soak your incision site. No swimming pools, lakes, baths or hot tubs until approved by Dr. Lisette Grinder.    Opioid pain medications will be prescribed postoperatively. You will receive a prescription upon discharge from the hospital. Take your pain medication as prescribed.  If pain is not effectively controlled, please notify our office.  Please call our office if you need a refill on your pain medication.  Do not wait until you are out of pain medication to call the office.  Please call when you have a 2-3 day supply left.  Prescriptions will not be filled on weekends or holidays.           Pain medication can be constipating.  To help prevent this, stay hydrated and continue stool softeners to maintain regular bowel movement while on pain medication.    If FMLA or Short Term Disability will be used, please provide this  paperwork ASAP.  Please fax any FMLA/Short Term Disability paperwork to [phone].  Please note that we require 72 business hours to complete these forms and have them signed by Dr. Lisette Grinder.      Laverle Patter BSN, RN, CNOR  Clinical Nurse Coordinator  Dr. Regino Bellow. [facility]  [facility]  [address]  [place], [place] [address]  [phone]  [phone] Fax  [email or web address]         During Cervical Disk Surgery  During surgery, your surgeon may remove all or part of the disk (diskectomy). To reach the cervical spine, he or she may make an incision in the front (anterior) or the back (posterior) of your neck. With the anterior approach, the neck may be made more stable with a fusion (joining) of the vertebrae. With the posterior approach, bone may be removed to enable your surgeon to reach the disk.      A metal plate may be used to keep the vertebrae stable.     Adding stability: fusion   After removing a disk from the front, your surgeon may fuse the vertebrae above and below it. This limits movement, helping to relieve pressure and pain. First, the surgeon enlarges the space between the vertebrae. The surgeon then ?plugs? the space with a plastic or metal cage filled with bone chips or stem cells or?a cylinder- or wedge-shaped bone graft. Metal plates may be added. As you heal, the graft and vertebrae grow together (fuse). After fusion, your ability to bend your neck may be slightly restricted.   Through the back: posterior approach  Your surgeon will make an incision (about 2 to 4 inches long) in the middle or next to the middle of the back of your neck. Then he or she may remove bone to reach the problem area. The surgeon then removes the damaged portion of the disk.      Area where lamina may be removed.        Area where foramina may be enlarged.     Removing bone   To reach the disk from the back, your surgeon may enlarge the foramina or remove a portion of the lamina. To help relieve pressure on the nerves or spinal cord, bone spurs may also be removed. The location and amount of bone removed depend on the type of problem you have.      A bone graft is inserted to plug the opening.        The disk is removed from between the vertebrae.     Through the front: anterior approach   Your surgeon will make a horizontal or vertical incision (about 1 to 3 inches long) on either side of your neck. To reach the disk, soft tissue is moved aside. All or part of the disk that is irritating the nerve is then removed. Your surgeon may remove bone spurs. The vertebrae may then be prepared for a fusion.   [employer] last reviewed this educational content on 10/31/2016  ? 2000-2021 [employer], [place]. All rights reserved. This information is not intended as a substitute for professional medical care. Always follow your healthcare professional's instructions.

## 2020-04-21 NOTE — Pre-Anesthesia Patient Instructions
GENERAL INFORMATION    Before you come to the hospital  Make arrangements for a responsible adult to drive you home and stay with you for 24 hours following surgery.  Bath/Shower Instructions  Take a bath or shower using the special soap given to you in PAC. Use half the bottle the night before, and the other half the morning of your procedure. Use clean towels with each bath or shower.  Put on clean clothes after bath or shower.  Avoid using lotion and oils.  If you are having surgery above the waist, wear a shirt that fastens up the front.  Sleep on clean sheets if bath or shower is done the night before procedure.  Leave money, credit cards, jewelry, and any other valuables at home. The [facility] is not responsible for the loss or breakage of personal items.  Remove nail polish, makeup and all jewelry (including piercings) before coming to the hospital.  The morning of your procedure:  brush your teeth and tongue  do not smoke  do not shave the area where you will have surgery    What to bring to the hospital  ID/ Insurance Card  Medical Device card  Official documents for legal guardianship   Copy of your Living Will, Advanced Directives, and/or Durable Power of Attorney   Small bag with a few personal belongings  CPAP/BiPAP machine (including all supplies)  Walker,cane, or motorized scooter  Cases for glasses/hearing aids/contact lens (bring solutions for contacts)  Dress in clean, loose, comfortable clothing     Eating or drinking before surgery  Do not eat or drink anything after 11:00 p.m. the day before your procedure (including gum, mints, candy, or chewing tobacco) OR follow the specific instructions you were given by your Surgeon.  You may have WATER ONLY up to 2 hours before arriving at the hospital.     Other instructions  Notify your surgeon if:  you become ill with a cough, fever, sore throat, nausea, vomiting or flu-like symptoms  you have any open wounds/sores that are red, painful, draining, or are new since you last saw  the doctor  you need to cancel your procedure  You will receive a call with your surgery arrival time from between 2:30pm and 4:30pm the last business day before your procedure.  If you do not receive a call, please call [phone] before 4:30pm or [phone] after 4:30pm.    Notify us at [facility]: [phone]  if you need to cancel your procedure  if you are going to be late    Arrival at the hospital    [facility]  [address]  [place], [place] [address]    Park in the [employer], located directly across from the main entrance to the hospital.  Judee Clara parking is available  from 7 AM to 4 PM Monday through Friday.  Enter through the ground floor main hospital entrance and check in at the Information Desk in the lobby.  They will validate your parking ticket and direct you to the next location.  If you are a woman between the ages of 88 and 36, and have not had a hysterectomy, you will be asked for a urine sample prior to surgery.  Please do not urinate before arriving in the Surgery Waiting Room.  Once there, check in and let the attendant know if you need to provide a sample.   For the safety of all patients, visitors and  staff as we work to contain COVID-19, we must restrict patient visitors.    Current Visitor Policy (12/24/19):    Our current, and ongoing, visitor rules in surgery and procedural areas are:    1 visitor per patient will be allowed to accompany the patient and wait in the Waiting Room  No visitors will be allowed into the pre/post areas       Patients in inpatient and pediatric units, Emergency Department, ambulatory clinics and lab appointments may now have two visitors at the same time.  For inpatient stays, there is no limit on the amount of visitors per day but only two visitors may visit at a time.  The policy applies to [facility] [place] [facility] [place], [address], [employer] and [place] campuses and clinics.      Exceptions include:  No visitors allowed for patients with active COVID-19 infections.  No visitors under the age of 21 for inpatients.  Parents or guardians may bring their children to their clinic appointments or bring siblings to their children's clinic appointment.  One visitor allowed during a patient?s cancer exam appointment; however, no visitors allowed during their cancer treatment/infusion appointment; check with staff for exceptions.  One visitor allowed in perioperative and procedural waiting rooms; no visitors allowed in pre/post areas unless a patient becomes an overnight boarder.  Two parents/guardians are allowed for surgical or procedural patients younger than 58 years old.  Adult inpatients in semiprivate rooms may have visitors, but visits should be coordinated so only two total visitors are in a room at a time due to space limitations.    Visitors must be free of fever and symptoms to be in our facilities. We ask visitors to follow these guidelines:  Wear a mask at all times, unless under the age of 2, have trouble breathing or are unconscious, incapacitated or otherwise unable to remove the cover without assistance.  Go directly to the nursing station in the unit you are visiting and do not linger in public areas.  Check in at the nursing station before going to the patient's room.  Maintain a physical distance of six feet from all others.  Follow elevator restrictions to four riding at a time - peak times are 6:30-7:30 a.m., noon and 6:30-7:30 p.m.  Be aware cafeteria peak times are 11 a.m. - 1 p.m.  Wash your hands frequently and cover your coughs and sneezes.     Coronavirus (COVID19) Information  If you get sick with fever (100.68F/38C or higher), cough, or have trouble breathing:  Call your primary care physician for questions or health needs.  Tell your doctor about any recent travel and your symptoms.  Check your MyChart for your COVID swab results. (MyChart notifications are immediate and patients are often know their test result before their surgeon is notified).  Notify your surgeon if you are COVID+ positive.  If you are COVID+ positive DO NOT come to the hospital for your surgery until your surgeon has instructed you on what to do. Wait for instructions to find out if you should stay home or if you should still have surgery.  Avoid contact with others.    For up to date information on the Coronavirus, visit the CDC website at DiningCalendar[employer]de.

## 2020-04-22 ENCOUNTER — Ambulatory Visit: Admit: 2020-04-22 | Discharge: 2020-04-22 | Payer: MEDICARE

## 2020-04-22 ENCOUNTER — Encounter: Admit: 2020-04-22 | Discharge: 2020-04-22 | Payer: MEDICARE

## 2020-04-22 DIAGNOSIS — S14109S Unspecified injury at unspecified level of cervical spinal cord, sequela: Secondary | ICD-10-CM

## 2020-04-22 DIAGNOSIS — Z419 Encounter for procedure for purposes other than remedying health state, unspecified: Secondary | ICD-10-CM

## 2020-04-22 DIAGNOSIS — M5134 Other intervertebral disc degeneration, thoracic region: Secondary | ICD-10-CM

## 2020-04-22 DIAGNOSIS — R29898 Other symptoms and signs involving the musculoskeletal system: Secondary | ICD-10-CM

## 2020-04-22 DIAGNOSIS — G959 Disease of spinal cord, unspecified: Secondary | ICD-10-CM

## 2020-04-22 DIAGNOSIS — T148XXA Other injury of unspecified body region, initial encounter: Secondary | ICD-10-CM

## 2020-04-22 DIAGNOSIS — Z01818 Encounter for other preprocedural examination: Secondary | ICD-10-CM

## 2020-04-22 DIAGNOSIS — K2270 Barrett's esophagus without dysplasia: Secondary | ICD-10-CM

## 2020-04-22 DIAGNOSIS — M4800 Spinal stenosis, site unspecified: Secondary | ICD-10-CM

## 2020-04-22 DIAGNOSIS — M5030 Other cervical disc degeneration, unspecified cervical region: Secondary | ICD-10-CM

## 2020-04-22 DIAGNOSIS — R200 Anesthesia of skin: Secondary | ICD-10-CM

## 2020-04-22 DIAGNOSIS — I10 Essential (primary) hypertension: Secondary | ICD-10-CM

## 2020-04-22 DIAGNOSIS — I2510 Atherosclerotic heart disease of native coronary artery without angina pectoris: Secondary | ICD-10-CM

## 2020-04-22 DIAGNOSIS — E785 Hyperlipidemia, unspecified: Secondary | ICD-10-CM

## 2020-04-22 DIAGNOSIS — R2689 Other abnormalities of gait and mobility: Secondary | ICD-10-CM

## 2020-04-22 DIAGNOSIS — M2550 Pain in unspecified joint: Secondary | ICD-10-CM

## 2020-04-22 DIAGNOSIS — K219 Gastro-esophageal reflux disease without esophagitis: Secondary | ICD-10-CM

## 2020-04-22 DIAGNOSIS — S14109D Unspecified injury at unspecified level of cervical spinal cord, subsequent encounter: Secondary | ICD-10-CM

## 2020-04-22 DIAGNOSIS — R258 Other abnormal involuntary movements: Secondary | ICD-10-CM

## 2020-04-22 DIAGNOSIS — G061 Intraspinal abscess and granuloma: Secondary | ICD-10-CM

## 2020-04-22 DIAGNOSIS — M5412 Radiculopathy, cervical region: Secondary | ICD-10-CM

## 2020-04-22 LAB — COMPREHENSIVE METABOLIC PANEL
Lab: 0.4 mg/dL (ref 0.3–1.2)
Lab: 1.3 mg/dL — ABNORMAL HIGH (ref >60)
Lab: 117 mg/dL — ABNORMAL HIGH (ref 70–100)
Lab: 12 mg/dL — ABNORMAL LOW (ref >60)
Lab: 142 MMOL/L (ref 137–147)
Lab: 20 U/L (ref 7–40)
Lab: 4.5 g/dL (ref 3.5–5.0)
Lab: 56 U/L (ref 25–110)
Lab: 7.3 g/dL (ref 6.0–8.0)
Lab: 9.9 mg/dL (ref 8.5–10.6)

## 2020-04-22 LAB — PROTIME INR (PT): Lab: 1 MMOL/L (ref 0.8–1.2)

## 2020-04-22 LAB — PTT (APTT): Lab: 30 s — ABNORMAL LOW (ref 24.0–36.5)

## 2020-04-22 LAB — CBC: Lab: 4.3 K/UL — ABNORMAL LOW (ref 4.5–11.0)
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
## 2020-05-04 ENCOUNTER — Encounter: Admit: 2020-05-04 | Discharge: 2020-05-04 | Payer: MEDICARE

## 2020-05-11 ENCOUNTER — Encounter: Admit: 2020-05-11 | Discharge: 2020-05-12 | Payer: MEDICARE

## 2020-05-11 DIAGNOSIS — Z20822 Encounter for screening laboratory testing for COVID-19 virus in asymptomatic patient: Principal | ICD-10-CM

## 2020-05-12 LAB — COVID-19 (SARS-COV-2) PCR

## 2020-05-14 ENCOUNTER — Encounter: Admit: 2020-05-14 | Discharge: 2020-05-14 | Payer: MEDICARE

## 2020-05-14 DIAGNOSIS — I10 Essential (primary) hypertension: Secondary | ICD-10-CM

## 2020-05-14 DIAGNOSIS — M5030 Other cervical disc degeneration, unspecified cervical region: Secondary | ICD-10-CM

## 2020-05-14 DIAGNOSIS — T148XXA Other injury of unspecified body region, initial encounter: Secondary | ICD-10-CM

## 2020-05-14 DIAGNOSIS — K2270 Barrett's esophagus without dysplasia: Secondary | ICD-10-CM

## 2020-05-14 DIAGNOSIS — I2510 Atherosclerotic heart disease of native coronary artery without angina pectoris: Secondary | ICD-10-CM

## 2020-05-14 DIAGNOSIS — E785 Hyperlipidemia, unspecified: Secondary | ICD-10-CM

## 2020-05-14 DIAGNOSIS — M5134 Other intervertebral disc degeneration, thoracic region: Secondary | ICD-10-CM

## 2020-05-14 DIAGNOSIS — G061 Intraspinal abscess and granuloma: Secondary | ICD-10-CM

## 2020-05-14 DIAGNOSIS — M4800 Spinal stenosis, site unspecified: Secondary | ICD-10-CM

## 2020-05-14 DIAGNOSIS — K219 Gastro-esophageal reflux disease without esophagitis: Secondary | ICD-10-CM

## 2020-05-14 DIAGNOSIS — M2550 Pain in unspecified joint: Secondary | ICD-10-CM

## 2020-05-14 MED ORDER — LIDOCAINE (PF) 200 MG/10 ML (2 %) IJ SYRG
INTRAVENOUS | 0 refills | Status: DC
Start: 2020-05-14 — End: 2020-05-14
  Administered 2020-05-14: 14:00:00 80 mg via INTRAVENOUS

## 2020-05-14 MED ORDER — KETAMINE 10 MG/ML IJ SOLN
INTRAVENOUS | 0 refills | Status: DC
Start: 2020-05-14 — End: 2020-05-14
  Administered 2020-05-14: 14:00:00 30 mg via INTRAVENOUS

## 2020-05-14 MED ORDER — ARTIFICIAL TEARS SINGLE DOSE DROPS GROUP
OPHTHALMIC | 0 refills | Status: DC
Start: 2020-05-14 — End: 2020-05-14

## 2020-05-14 MED ORDER — CEFAZOLIN 1 GRAM IJ SOLR
INTRAVENOUS | 0 refills | Status: DC
Start: 2020-05-14 — End: 2020-05-14
  Administered 2020-05-14: 14:00:00 2 g via INTRAVENOUS

## 2020-05-14 MED ORDER — REMIFENTANYL 1000MCG IN NS 20ML (OR)
INTRAVENOUS | 0 refills | Status: DC
Start: 2020-05-14 — End: 2020-05-14
  Administered 2020-05-14: 15:00:00 .08 ug/kg/min via INTRAVENOUS
  Administered 2020-05-14: 14:00:00 .05 ug/kg/min via INTRAVENOUS
  Administered 2020-05-14: 15:00:00 .08 ug/kg/min via INTRAVENOUS
  Administered 2020-05-14: 14:00:00 .05 ug/kg/min via INTRAVENOUS

## 2020-05-14 MED ORDER — PROPOFOL INJ 10 MG/ML IV VIAL
INTRAVENOUS | 0 refills | Status: DC
Start: 2020-05-14 — End: 2020-05-14
  Administered 2020-05-14: 14:00:00 150 mg via INTRAVENOUS
  Administered 2020-05-14: 16:00:00 10 mg via INTRAVENOUS

## 2020-05-14 MED ORDER — ONDANSETRON HCL (PF) 4 MG/2 ML IJ SOLN
INTRAVENOUS | 0 refills | Status: DC
Start: 2020-05-14 — End: 2020-05-14
  Administered 2020-05-14: 16:00:00 4 mg via INTRAVENOUS

## 2020-05-14 MED ORDER — DEXAMETHASONE SODIUM PHOSPHATE 4 MG/ML IJ SOLN
INTRAVENOUS | 0 refills | Status: DC
Start: 2020-05-14 — End: 2020-05-14
  Administered 2020-05-14: 16:00:00 6 mg via INTRAVENOUS

## 2020-05-14 MED ORDER — ELECTROLYTE-A IV SOLP
INTRAVENOUS | 0 refills | Status: DC
Start: 2020-05-14 — End: 2020-05-14
  Administered 2020-05-14: 14:00:00 via INTRAVENOUS

## 2020-05-14 MED ORDER — SUCCINYLCHOLINE CHLORIDE 20 MG/ML IJ SOLN
INTRAVENOUS | 0 refills | Status: DC
Start: 2020-05-14 — End: 2020-05-14
  Administered 2020-05-14: 14:00:00 100 mg via INTRAVENOUS

## 2020-05-14 MED ORDER — FENTANYL CITRATE (PF) 50 MCG/ML IJ SOLN
INTRAVENOUS | 0 refills | Status: DC
Start: 2020-05-14 — End: 2020-05-14
  Administered 2020-05-14: 14:00:00 50 ug via INTRAVENOUS

## 2020-05-14 MED ORDER — PHENYLEPHRINE 40 MCG/ML IN NS IV DRIP (STD CONC)
INTRAVENOUS | 0 refills | Status: DC
Start: 2020-05-14 — End: 2020-05-14
  Administered 2020-05-14 (×2): .3 ug/kg/min via INTRAVENOUS

## 2020-05-14 MED ORDER — GLYCOPYRROLATE 0.2 MG/ML IJ SOLN
INTRAVENOUS | 0 refills | Status: DC
Start: 2020-05-14 — End: 2020-05-14
  Administered 2020-05-14 (×2): .2 mg via INTRAVENOUS

## 2020-05-14 MED ORDER — PROPOFOL 10 MG/ML IV EMUL 100 ML (INFUSION)(AM)(OR)
INTRAVENOUS | 0 refills | Status: DC
Start: 2020-05-14 — End: 2020-05-14
  Administered 2020-05-14: 14:00:00 150 ug/kg/min via INTRAVENOUS
  Administered 2020-05-14: 15:00:00 120 ug/kg/min via INTRAVENOUS

## 2020-05-14 MED ADMIN — THROMBIN (BOVINE) 5,000 UNIT TP SPSY [312753]: 2 | TOPICAL | @ 15:00:00 | Stop: 2020-05-14 | NDC 60793070505

## 2020-05-14 MED ADMIN — FENTANYL CITRATE (PF) 50 MCG/ML IJ SOLN [3037]: 12.5 ug | INTRAVENOUS | @ 17:00:00 | Stop: 2020-05-14 | NDC 63323080612

## 2020-05-14 MED ADMIN — HYDROMORPHONE (PF) 2 MG/ML IJ SYRG [163476]: 0.5 mg | INTRAVENOUS | @ 17:00:00 | Stop: 2020-05-14 | NDC 00409131203

## 2020-05-14 MED ADMIN — ACETAMINOPHEN 500 MG PO TAB [102]: 1000 mg | ORAL | @ 17:00:00 | Stop: 2020-05-16 | NDC 00904673061

## 2020-05-14 MED ADMIN — HYDROMORPHONE (PF) 2 MG/ML IJ SYRG [163476]: 1 mg | INTRAVENOUS | @ 16:00:00 | Stop: 2020-05-14 | NDC 00409131203

## 2020-05-14 MED ADMIN — DEXAMETHASONE SODIUM PHOS (PF) 10 MG/ML IJ SOLN [136313]: 8 mg | INTRAVENOUS | @ 21:00:00 | Stop: 2020-05-15 | NDC 70069002101

## 2020-05-14 MED ADMIN — CYCLOBENZAPRINE 10 MG PO TAB [2017]: 10 mg | ORAL | @ 21:00:00 | NDC 69097084607

## 2020-05-14 MED ADMIN — HYDROMORPHONE (PF) 2 MG/ML IJ SYRG [163476]: 0.5 mg | INTRAVENOUS | @ 16:00:00 | Stop: 2020-05-14 | NDC 00409131203

## 2020-05-14 MED ADMIN — FENTANYL CITRATE (PF) 50 MCG/ML IJ SOLN [3037]: 12.5 ug | INTRAVENOUS | @ 16:00:00 | Stop: 2020-05-14 | NDC 63323080612

## 2020-05-14 MED ADMIN — OXYCODONE 5 MG PO TAB [10814]: 5 mg | ORAL | @ 21:00:00 | Stop: 2020-05-16 | NDC 00406055223

## 2020-05-14 MED ADMIN — SODIUM CHLORIDE 0.9 % IV SOLP [27838]: 1000 mL | INTRAVENOUS | @ 13:00:00 | Stop: 2020-05-16 | NDC 00338004904

## 2020-05-14 MED ADMIN — OXYCODONE 5 MG PO TAB [10814]: 10 mg | ORAL | @ 17:00:00 | Stop: 2020-05-14 | NDC 00406055223

## 2020-05-14 MED ADMIN — FAMOTIDINE 20 MG PO TAB [10011]: 20 mg | ORAL | @ 20:00:00 | NDC 63739064510

## 2020-05-14 MED ADMIN — CEFAZOLIN INJ 1GM IVP [210319]: 2 g | INTRAVENOUS | @ 21:00:00 | Stop: 2020-05-15 | NDC 60505614200

## 2020-05-14 MED ADMIN — OXYCODONE 5 MG PO TAB [10814]: 15 mg | ORAL | Stop: 2020-05-16 | NDC 00406055223

## 2020-05-14 MED ADMIN — GABAPENTIN 300 MG PO CAP [18308]: 600 mg | ORAL | @ 21:00:00 | NDC 00904666661

## 2020-05-14 MED ADMIN — SODIUM CHLORIDE 0.9 % IV SOLP [27838]: 1000.000 mL | INTRAVENOUS | @ 16:00:00 | NDC 00338004904

## 2020-05-15 MED ADMIN — FAMOTIDINE 20 MG PO TAB [10011]: 20 mg | ORAL | @ 14:00:00 | Stop: 2020-05-15 | NDC 63739064510

## 2020-05-15 MED ADMIN — CYCLOBENZAPRINE 10 MG PO TAB [2017]: 10 mg | ORAL | @ 03:00:00 | NDC 69097084607

## 2020-05-15 MED ADMIN — FAMOTIDINE 20 MG PO TAB [10011]: 20 mg | ORAL | @ 03:00:00 | NDC 63739064510

## 2020-05-15 MED ADMIN — OXYCODONE 15 MG PO TAB [28899]: 15 mg | ORAL | @ 11:00:00 | Stop: 2020-05-15 | NDC 68094000559

## 2020-05-15 MED ADMIN — DEXAMETHASONE SODIUM PHOS (PF) 10 MG/ML IJ SOLN [136313]: 8 mg | INTRAVENOUS | @ 12:00:00 | Stop: 2020-05-15 | NDC 70069002101

## 2020-05-15 MED ADMIN — CEFAZOLIN INJ 1GM IVP [210319]: 2 g | INTRAVENOUS | @ 05:00:00 | Stop: 2020-05-15 | NDC 60505614200

## 2020-05-15 MED ADMIN — WATER FOR INJECTION, STERILE IJ SOLN [79513]: 20 mL | INTRAVENOUS | @ 05:00:00 | Stop: 2020-05-15 | NDC 00409488723

## 2020-05-15 MED ADMIN — GABAPENTIN 300 MG PO CAP [18308]: 600 mg | ORAL | @ 14:00:00 | Stop: 2020-05-15 | NDC 00904666661

## 2020-05-15 MED ADMIN — POLYETHYLENE GLYCOL 3350 17 GRAM PO PWPK [25424]: 17 g | ORAL | @ 03:00:00 | NDC 00904693186

## 2020-05-15 MED ADMIN — GABAPENTIN 300 MG PO CAP [18308]: 600 mg | ORAL | @ 03:00:00 | NDC 00904666661

## 2020-05-15 MED ADMIN — OXYCODONE 15 MG PO TAB [28899]: 15 mg | ORAL | @ 14:00:00 | Stop: 2020-05-15 | NDC 68094000559

## 2020-05-15 MED ADMIN — SODIUM CHLORIDE 0.9 % IV SOLP [27838]: 1000.000 mL | INTRAVENOUS | @ 03:00:00 | NDC 00338004904

## 2020-05-15 MED ADMIN — OXYCODONE 5 MG PO TAB [10814]: 15 mg | ORAL | @ 03:00:00 | Stop: 2020-05-16 | NDC 00406055223

## 2020-05-15 MED ADMIN — ACETAMINOPHEN 500 MG PO TAB [102]: 1000 mg | ORAL | @ 11:00:00 | Stop: 2020-05-15 | NDC 00904673061

## 2020-05-15 MED ADMIN — DEXAMETHASONE SODIUM PHOS (PF) 10 MG/ML IJ SOLN [136313]: 8 mg | INTRAVENOUS | @ 05:00:00 | Stop: 2020-05-15 | NDC 70069002101

## 2020-05-15 MED ADMIN — CYCLOBENZAPRINE 10 MG PO TAB [2017]: 10 mg | ORAL | @ 14:00:00 | Stop: 2020-05-15 | NDC 69097084607

## 2020-05-15 MED FILL — TRAMADOL 50 MG PO TAB: 50 mg | ORAL | 8 days supply | Qty: 60 | Fill #1 | Status: CP

## 2020-05-25 ENCOUNTER — Encounter: Admit: 2020-05-25 | Discharge: 2020-05-25 | Payer: MEDICARE

## 2020-05-25 DIAGNOSIS — M2550 Pain in unspecified joint: Secondary | ICD-10-CM

## 2020-05-25 DIAGNOSIS — M5134 Other intervertebral disc degeneration, thoracic region: Secondary | ICD-10-CM

## 2020-05-25 DIAGNOSIS — M5030 Other cervical disc degeneration, unspecified cervical region: Secondary | ICD-10-CM

## 2020-05-25 DIAGNOSIS — M4800 Spinal stenosis, site unspecified: Secondary | ICD-10-CM

## 2020-05-25 DIAGNOSIS — K219 Gastro-esophageal reflux disease without esophagitis: Secondary | ICD-10-CM

## 2020-05-25 DIAGNOSIS — I2510 Atherosclerotic heart disease of native coronary artery without angina pectoris: Secondary | ICD-10-CM

## 2020-05-25 DIAGNOSIS — T148XXA Other injury of unspecified body region, initial encounter: Secondary | ICD-10-CM

## 2020-05-25 DIAGNOSIS — K2270 Barrett's esophagus without dysplasia: Secondary | ICD-10-CM

## 2020-05-25 DIAGNOSIS — I10 Essential (primary) hypertension: Secondary | ICD-10-CM

## 2020-05-25 DIAGNOSIS — E785 Hyperlipidemia, unspecified: Secondary | ICD-10-CM

## 2020-05-25 DIAGNOSIS — G061 Intraspinal abscess and granuloma: Secondary | ICD-10-CM

## 2020-06-08 ENCOUNTER — Encounter: Admit: 2020-06-08 | Discharge: 2020-06-08 | Payer: MEDICARE

## 2020-06-08 DIAGNOSIS — G253 Myoclonus: Secondary | ICD-10-CM

## 2020-06-08 DIAGNOSIS — G959 Disease of spinal cord, unspecified: Secondary | ICD-10-CM

## 2020-06-08 MED ORDER — GABAPENTIN 600 MG PO TAB
ORAL_TABLET | Freq: Three times a day (TID) | 3 refills
Start: 2020-06-08 — End: ?

## 2020-06-09 ENCOUNTER — Encounter: Admit: 2020-06-09 | Discharge: 2020-06-09 | Payer: MEDICARE

## 2020-06-09 DIAGNOSIS — Z09 Encounter for follow-up examination after completed treatment for conditions other than malignant neoplasm: Secondary | ICD-10-CM

## 2020-06-10 ENCOUNTER — Ambulatory Visit: Admit: 2020-06-10 | Discharge: 2020-06-10 | Payer: MEDICARE

## 2020-06-10 ENCOUNTER — Encounter: Admit: 2020-06-10 | Discharge: 2020-06-10 | Payer: MEDICARE

## 2020-06-10 DIAGNOSIS — G959 Disease of spinal cord, unspecified: Secondary | ICD-10-CM

## 2020-06-10 DIAGNOSIS — K219 Gastro-esophageal reflux disease without esophagitis: Secondary | ICD-10-CM

## 2020-06-10 DIAGNOSIS — M5134 Other intervertebral disc degeneration, thoracic region: Secondary | ICD-10-CM

## 2020-06-10 DIAGNOSIS — T148XXA Other injury of unspecified body region, initial encounter: Secondary | ICD-10-CM

## 2020-06-10 DIAGNOSIS — Z09 Encounter for follow-up examination after completed treatment for conditions other than malignant neoplasm: Secondary | ICD-10-CM

## 2020-06-10 DIAGNOSIS — K2270 Barrett's esophagus without dysplasia: Secondary | ICD-10-CM

## 2020-06-10 DIAGNOSIS — S14109D Unspecified injury at unspecified level of cervical spinal cord, subsequent encounter: Secondary | ICD-10-CM

## 2020-06-10 DIAGNOSIS — R2689 Other abnormalities of gait and mobility: Secondary | ICD-10-CM

## 2020-06-10 DIAGNOSIS — M5412 Radiculopathy, cervical region: Secondary | ICD-10-CM

## 2020-06-10 DIAGNOSIS — E785 Hyperlipidemia, unspecified: Secondary | ICD-10-CM

## 2020-06-10 DIAGNOSIS — R29898 Other symptoms and signs involving the musculoskeletal system: Secondary | ICD-10-CM

## 2020-06-10 DIAGNOSIS — M5030 Other cervical disc degeneration, unspecified cervical region: Secondary | ICD-10-CM

## 2020-06-10 DIAGNOSIS — I10 Essential (primary) hypertension: Secondary | ICD-10-CM

## 2020-06-10 DIAGNOSIS — I2510 Atherosclerotic heart disease of native coronary artery without angina pectoris: Secondary | ICD-10-CM

## 2020-06-10 DIAGNOSIS — R200 Anesthesia of skin: Secondary | ICD-10-CM

## 2020-06-10 DIAGNOSIS — R258 Other abnormal involuntary movements: Secondary | ICD-10-CM

## 2020-06-10 DIAGNOSIS — G061 Intraspinal abscess and granuloma: Secondary | ICD-10-CM

## 2020-06-10 DIAGNOSIS — M2550 Pain in unspecified joint: Secondary | ICD-10-CM

## 2020-06-10 DIAGNOSIS — M4800 Spinal stenosis, site unspecified: Secondary | ICD-10-CM

## 2020-06-10 DIAGNOSIS — S14109S Unspecified injury at unspecified level of cervical spinal cord, sequela: Secondary | ICD-10-CM

## 2020-06-10 NOTE — Progress Notes
[facility]. [facility] Pugh, [facility]  Follow - Up Visit  Subjective     REASON FOR VISIT   Pain of the Neck    SUBJECTIVE     [name] presents for initial post-operative check 4 weeks s/p C5-6 ACDF on 05/14/2020.  He is doing very well.  His balance and coordination are much improved.  He is not using any assistive devices.  His right arm strength has greatly improved.  He has more range of motion of his neck.          ROS: Review of Systems   Musculoskeletal: Positive for neck pain.   All other systems reviewed and are negative.    A 10-point ROS was performed and negative.    PHYSICAL EXAM   Blood pressure (!) 143/75, pulse 64, resp. rate 18, height 172.7 cm (68), weight 102.1 kg (225 lb), SpO2 100 %.  Body mass index is 34.21 kg/m?Marland Kitchen  Oswestry Total Score:: 50  Pain Score: Four    Constitutional: Alert, NAD  Head: Atraumatic  Eyes: EOMI  Respiratory: Unlabored breathing  Cardiovascular: Regular rate  Skin: No rashes or open wounds appreciated on back  Musculoskeletal: Strength stable  Neurologic: Sensation stable    Upper Extremity Motor Exam   Deltoid Biceps Triceps WF WE Intrinsics Grip   RIGHT   5 5 5 5 5 5 5    LEFT 5 5 5 5 5 5 5      Anterior neck incision is healing appropriately.  There is no erythema, fluid collections or signs of infection.      RADIOGRAPHS     Cervical films from today demonstrate C5-6 ACDF that has been added to his previous C3-4, 4-5 implants.  The metallic interbody spacer is in place without evidence of migration or failure or subsidence.  Plate and screw construct intact.       ASSESSMENT / PLAN     Chanan Detwiler Atiyeh is a 59 y.o. male with:    1. Surgery follow-up examination     2. Cervical radiculopathy     3. Hand weakness     4. Hand numbness     5. 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 Cervical myelopathy (HCC)     7. Balance problems     8. 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 Clonus         He has done extremely well despite only being four weeks out from surgery.  He is up walking without assistive devices whereas before he was in a wheelchair.  His upper extremity strength has returned.  He should continue to avoid heavy lifting.  We will plan to follow up with him in two months.  I am happy to see him back sooner if he has any problems in between now and then.         In the presence of Marcelline Deist, MD , I have taken down these notes, Mamie Laurel, Scribe. June 10, 2020 11:34 AM      Dwyane Luo. Lisette Grinder, MD, MPH  Spinal Surgery  [place]. Clydene Pugh MD, [facility]  Nurse: Laverle Patter, BSN, RN, CNOR   [phone]  -  [email or web address]

## 2020-06-10 NOTE — Patient Instructions
It was a pleasure seeing you in clinic today.  Please don't hesitate to call if you have any questions.      Kabeer Hoagland BSN, RN, CNOR  Clinical Nurse Coordinator  Dr. Brandon Carlson  [facility]  [facility]  [address]  [place], [place] [address]  [email or web address]  Phone: [phone]  Fax:  [phone]  Scheduling [phone]

## 2020-06-18 ENCOUNTER — Encounter: Admit: 2020-06-18 | Discharge: 2020-06-18 | Payer: MEDICARE

## 2020-06-18 ENCOUNTER — Ambulatory Visit: Admit: 2020-06-18 | Discharge: 2020-06-18 | Payer: MEDICARE

## 2020-06-18 DIAGNOSIS — I2510 Atherosclerotic heart disease of native coronary artery without angina pectoris: Secondary | ICD-10-CM

## 2020-06-18 DIAGNOSIS — M4800 Spinal stenosis, site unspecified: Secondary | ICD-10-CM

## 2020-06-18 DIAGNOSIS — G253 Myoclonus: Secondary | ICD-10-CM

## 2020-06-18 DIAGNOSIS — M5134 Other intervertebral disc degeneration, thoracic region: Secondary | ICD-10-CM

## 2020-06-18 DIAGNOSIS — M4714 Other spondylosis with myelopathy, thoracic region: Secondary | ICD-10-CM

## 2020-06-18 DIAGNOSIS — E785 Hyperlipidemia, unspecified: Secondary | ICD-10-CM

## 2020-06-18 DIAGNOSIS — K2270 Barrett's esophagus without dysplasia: Secondary | ICD-10-CM

## 2020-06-18 DIAGNOSIS — M5030 Other cervical disc degeneration, unspecified cervical region: Secondary | ICD-10-CM

## 2020-06-18 DIAGNOSIS — I10 Essential (primary) hypertension: Secondary | ICD-10-CM

## 2020-06-18 DIAGNOSIS — G959 Disease of spinal cord, unspecified: Secondary | ICD-10-CM

## 2020-06-18 DIAGNOSIS — M2550 Pain in unspecified joint: Secondary | ICD-10-CM

## 2020-06-18 DIAGNOSIS — G061 Intraspinal abscess and granuloma: Secondary | ICD-10-CM

## 2020-06-18 DIAGNOSIS — K219 Gastro-esophageal reflux disease without esophagitis: Secondary | ICD-10-CM

## 2020-06-18 DIAGNOSIS — T148XXA Other injury of unspecified body region, initial encounter: Secondary | ICD-10-CM

## 2020-06-18 MED ORDER — GABAPENTIN 300 MG PO CAP
900 mg | ORAL_CAPSULE | Freq: Three times a day (TID) | ORAL | 5 refills | Status: AC
Start: 2020-06-18 — End: ?

## 2020-06-18 NOTE — Progress Notes
Date of Service: 06/18/2020     Subjective:               Darin [name] is a 59 y.o. male.        History of Present Illness    Last visit with me was in April 2021.  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  I started clonazepam 0.5 mg twice daily for chronic cervical myoclonus/diffuse clonus.    He has had generalized weakness.  I ordered lumbar puncture.  CSF RBC 0 WBC 2 negative for malignancy, protein 49 glucose 69 ACE normal kappa free light chains negative Gram stain negative fungal culture negative, bacterial culture no growth, Lyme negative, cryptococcus antigen negative    I referred him to Dr. Lisette Grinder in orthopedic surgery.  He underwent ACDF C5-C6 in November 2021.  With cage placement.  He last saw Dr. Lisette Grinder in clinic earlier this month.  He reported to be doing very well.  Balance and coordination was much better was not using any assistive devices.  Strength of the right arm was much better also.  He was in wheelchair before but is now walking without difficulty.    He notes that he has had ongoing jerking of the legs.  Often can happen more so at night when he lies down to sleep.  He does not feel the urge to kick his legs.  There is some jerking of the legs while sleeping briefly.    He has had some tingling in the legs and feet but no sensation of crawling or bugs.    Balance is better.  He has ongoing bladder and bowel issues but nothing new.    He feels stronger.  He admits to some fatigue in the legs which she thinks is there because he has not used them for so long.  Overall feels significantly better compared to when I saw him last and post surgically in particular.    MRI thoracic spine from 2020 showed T2 hyperintensity and dorsal column cord volume loss from C4-C6.  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  Central protrusion T5-T6 with at least mild central stenosis.  Focal area of cord hyperintensity cephalad to this at T5 with upper thoracic cord marginal irregularity.  This is probably gliosis from prior trauma, infection or inflammation.    He has no new neurological concerns or complaints.  He did not tolerate clonazepam for clonus and broke out in a rash.  He no longer takes it.  I tried Keppra for him before but he stopped it because it was not working.         Review of Systems   Constitutional: Negative.    HENT: Negative.    Eyes: Negative.    Respiratory: Negative.    Cardiovascular: Negative.    Gastrointestinal: Negative.    Endocrine: Negative.    Genitourinary: Negative.    Musculoskeletal: Negative.    Skin: Negative.    Allergic/Immunologic: Negative.    Neurological: Negative.    Hematological: Negative.    Psychiatric/Behavioral: Negative.    All other systems reviewed and are negative.      Chief Complaint:  Chief Complaint   Patient presents with   ? Follow Up     generilized weakness       Past Medical History:  Medical History:   Diagnosis Date   ? Acid reflux    ? Barrett's esophagus    ?  Coronary artery disease    ? Degenerative disc disease, cervical    ? 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 Essential hypertension 09/14/2017   ? Hyperlipemia 09/14/2017   ? 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 Spinal cord abscess    ? Spinal stenosis        Surgical History:  Surgical History:   Procedure Laterality Date   ? HX HEART CATHETERIZATION  2003    with stent placement x 1   ? ANTERIOR CERVICAL DISCECTOMY AND FUSION CERVICAL 5-6,  CAGE PLACEMENT CERVICAL 5-6 N/A 05/14/2020    Performed by Marcelline Deist, MD at [facility] OR   ? ANTERIOR INSTRUMENTATION - 2 TO 3 VERTEBRAL SEGMENTS N/A 05/14/2020    Performed by Marcelline Deist, MD at [facility] OR   ? BACLOFEN PUMP IMPLANTATION      since removed   ? BICEPS TENDON REPAIR     ? HX BACK SURGERY     ? HX TONSILLECTOMY     ? LAMINECTOMY     ? SPINAL FUSION c3-c5 in [place]   ? UMBILICAL ARTERIAL CATH - BEDSIDE         Social History:   Social History     Socioeconomic History   ? Marital status: Married     Spouse name: Not on file   ? Number of children: Not on file   ? Years of education: Not on file   ? Highest education level: Not on file   Occupational History   ? Not on file   Tobacco Use   ? Smoking status: Never Smoker   ? Smokeless tobacco: Never Used   ? Tobacco comment: nO HX   Vaping Use   ? Vaping Use: Never used   Substance and Sexual Activity   ? Alcohol use: No   ? Drug use: No     Comment: No HX   ? Sexual activity: Yes     Partners: Female     Birth control/protection: None   Other Topics Concern   ? Not on file   Social History Narrative   ? Not on file             Family History:  Family History   Problem Relation Age of Onset   ? Alzheimer's Mother    ? Coronary Artery Disease Father    ? Cancer Father    ? Heart Disease Brother    ? Diabetes Brother    ? Heart problem Brother         Triple bypass   ? None Reported Son    ? None Reported Maternal Grandmother    ? None Reported Maternal Grandfather    ? Cancer-Colon Paternal Grandmother    ? Heart Disease Paternal Grandfather    ? Heart Disease Brother    ? Heart Attack Brother    ? Heart problem Brother    ? Cancer Brother        Allergies:  Allergies   Allergen Reactions   ? Clonazepam RASH   ? Morphine VOMITING       Objective:         ? acetaminophen (TYLENOL EXTRA STRENGTH) 500 mg tablet Take two tablets by mouth every 6 hours as needed for Pain. Max of 4,000 mg of acetaminophen in 24 hours.   ? calcium carbonate (TUMS  PO) Take 1 tablet by mouth daily.   ? carisoprodoL (SOMA) 350 mg tablet Take 350 mg by mouth three times daily.   ? esomeprazole DR(+) (NEXIUM) 20 mg capsule Take 20 mg by mouth every morning. Take on an empty stomach at least 1 hour before or 2 hours after food.   ? fexofenadine (ALLEGRA) 180 mg tablet Take 180 mg by mouth daily as needed.   ? gabapentin (NEURONTIN) 600 mg tablet 1 tab TID   ? traMADoL (ULTRAM) 50 mg tablet Take one tablet to two tablets by mouth every 6 hours as needed for Pain.     Vitals:    06/18/20 1254   BP: 133/82   BP Source: Arm, Right Upper   Patient Position: Sitting   Pulse: 70   Resp: 18   Temp: 36.5 ?C (97.7 ?F)   TempSrc: Oral   SpO2: 97%   Weight: 99.8 kg (220 lb)   Height: 172.7 cm (68)   PainSc: Five     Body mass index is 33.45 kg/m?Marland Kitchen       Physical Exam    General: WG/WN,?AAO x4, NAD  HEENT: NC/AT  Cardiac: RRR without murmur  Speech: fluent, no dysarthria or aphasia  CN: PERRL, EOMI, no facial droop or ptosis, tongue midline, palate midline  Strength:?5/5?SA/EF/EE/WE/FA/TA, 4/4?HF?5-/5-?KE/DF/PF  Prominent action induced involuntary clonic/myoclonic?movements?in legs, none in arm, positive and?negative myoclonus  Sensation: decreased LT RLE?  DTRs: 3/3?in BR/B, 3/3P, 4/2A,?downgoing?plantar reflexes bilaterally - bilateral global leg clonus  Gait:??fluid gait, normal cadence, no ataxia of primary gait, no further wheelchair use       Assessment and Plan:    1.  Cervicothoracic myelopathy  2.  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  Chronic cervico-thoracic/segmental spinal myoclonus  4.  Abnormal thoracic MRI with cord lesion    Plan:  1.  Repeat MRI thoracic cord with gadolinium-confirm lesions stability  2.  Increase gabapentin to 900 mg 3 times daily,  Caution with renal insufficiency  3.  Did not tolerate clonazepam.    4. Consider retrial of Keppra 500 mg BID - did not have benefit before  5.  Pattern is not typical for restless leg syndrome so I do not think dopaminergic therapy likely to be helpful.  4.  Patient refuses physical therapy  5.  Follow-up Dr. Lisette Grinder orthospine surgery.  Discussed not only cervical surgery but any potential need for thoracic intervention  6.  Follow-up rehab medicine.  Gradually increase activity.  7. Check CMP in 6 weeks - confirm stable renal function  8. RTC with me in about 3 months.

## 2020-07-08 ENCOUNTER — Encounter: Admit: 2020-07-08 | Discharge: 2020-07-08 | Payer: MEDICARE

## 2020-07-20 ENCOUNTER — Encounter: Admit: 2020-07-20 | Discharge: 2020-07-20 | Payer: MEDICARE

## 2020-07-20 ENCOUNTER — Ambulatory Visit: Admit: 2020-07-20 | Discharge: 2020-07-20 | Payer: MEDICARE

## 2020-07-20 DIAGNOSIS — G959 Disease of spinal cord, unspecified: Secondary | ICD-10-CM

## 2020-07-20 DIAGNOSIS — G253 Myoclonus: Secondary | ICD-10-CM

## 2020-07-20 DIAGNOSIS — M4714 Other spondylosis with myelopathy, thoracic region: Secondary | ICD-10-CM

## 2020-07-20 LAB — COMPREHENSIVE METABOLIC PANEL
Lab: 0.5 mg/dL (ref 0.3–1.2)
Lab: 10 (ref 3–12)
Lab: 105 MMOL/L (ref 98–110)
Lab: 15 mg/dL (ref 7–25)
Lab: 19 U/L (ref 7–40)
Lab: 26 MMOL/L (ref 21–30)
Lab: 28 U/L (ref 7–56)
Lab: 4.4 g/dL (ref 3.5–5.0)
Lab: 6.6 g/dL (ref 6.0–8.0)
Lab: 64 U/L (ref 25–110)
Lab: 9.1 mg/dL (ref 8.5–10.6)

## 2020-07-20 MED ORDER — GADOBENATE DIMEGLUMINE 529 MG/ML (0.1MMOL/0.2ML) IV SOLN
20 mL | Freq: Once | INTRAVENOUS | 0 refills | Status: CP
Start: 2020-07-20 — End: ?

## 2020-08-11 ENCOUNTER — Encounter

## 2020-08-11 DIAGNOSIS — Z09 Encounter for follow-up examination after completed treatment for conditions other than malignant neoplasm: Secondary | ICD-10-CM

## 2020-08-17 ENCOUNTER — Encounter

## 2020-08-17 DIAGNOSIS — R29898 Other symptoms and signs involving the musculoskeletal system: Secondary | ICD-10-CM

## 2020-08-17 DIAGNOSIS — T148XXA Other injury of unspecified body region, initial encounter: Secondary | ICD-10-CM

## 2020-08-17 DIAGNOSIS — S14109D Unspecified injury at unspecified level of cervical spinal cord, subsequent encounter: Secondary | ICD-10-CM

## 2020-08-17 DIAGNOSIS — M5412 Radiculopathy, cervical region: Secondary | ICD-10-CM

## 2020-08-17 DIAGNOSIS — R200 Anesthesia of skin: Secondary | ICD-10-CM

## 2020-08-17 DIAGNOSIS — Z09 Encounter for follow-up examination after completed treatment for conditions other than malignant neoplasm: Secondary | ICD-10-CM

## 2020-08-17 DIAGNOSIS — E785 Hyperlipidemia, unspecified: Secondary | ICD-10-CM

## 2020-08-17 DIAGNOSIS — K219 Gastro-esophageal reflux disease without esophagitis: Secondary | ICD-10-CM

## 2020-08-17 DIAGNOSIS — R2689 Other abnormalities of gait and mobility: Secondary | ICD-10-CM

## 2020-08-17 DIAGNOSIS — G061 Intraspinal abscess and granuloma: Secondary | ICD-10-CM

## 2020-08-17 DIAGNOSIS — M4800 Spinal stenosis, site unspecified: Secondary | ICD-10-CM

## 2020-08-17 DIAGNOSIS — I2510 Atherosclerotic heart disease of native coronary artery without angina pectoris: Secondary | ICD-10-CM

## 2020-08-17 DIAGNOSIS — R258 Other abnormal involuntary movements: Secondary | ICD-10-CM

## 2020-08-17 DIAGNOSIS — G959 Disease of spinal cord, unspecified: Secondary | ICD-10-CM

## 2020-08-17 DIAGNOSIS — I10 Essential (primary) hypertension: Secondary | ICD-10-CM

## 2020-08-17 DIAGNOSIS — K2270 Barrett's esophagus without dysplasia: Secondary | ICD-10-CM

## 2020-08-17 DIAGNOSIS — M5134 Other intervertebral disc degeneration, thoracic region: Secondary | ICD-10-CM

## 2020-08-17 DIAGNOSIS — M5030 Other cervical disc degeneration, unspecified cervical region: Secondary | ICD-10-CM

## 2020-08-17 DIAGNOSIS — M2550 Pain in unspecified joint: Secondary | ICD-10-CM

## 2020-08-17 NOTE — Progress Notes
[facility]arin A. [facility] Pugh, [facility]  Follow - Up Visit  Subjective     REASON FOR VISIT   Pain of the Middle Back and Pain of the Neck    SUBJECTIVE     Darin [name] returns for repeat evaluation 3 months  s/p C5-6 ACDF on 05/14/2020.  He is doing well.  He complains of mostly stiffness.  He has good strength of his right hand and the tremoring is gone.         ROS: Review of Systems   Musculoskeletal: Positive for back pain and neck pain.   All other systems reviewed and are negative.      PHYSICAL EXAM   Blood pressure (!) 147/85, pulse 64, resp. rate 16, height 172.7 cm (5' 8), weight 97.5 kg (215 lb), SpO2 100 %.  Body mass index is 32.69 kg/m?Marland Kitchen  Oswestry Total Score:: 52  Pain Score: Seven    Constitutional: Alert, NAD  Head: Atraumatic  Eyes: EOMI  Respiratory: Unlabored breathing  Cardiovascular: Regular rate  Skin: No rashes or open wounds appreciated on back  Musculoskeletal: Strength stable  Neurologic: Sensation stable    Anterior neck incision is healed nicely.  No erythema, fluid collections or signs of infection.      RADIOGRAPHS     Cervical films demonstrate a C5-6 ACDF.  Previous fusion at C3-4 and 4-5 is again noted.  The new instrumentation and new cage and fusion at C5-6 appears to be stable.  No evidence of loosening or failure.  No evidence of subsidence.       ASSESSMENT / PLAN     Darin [name] is a 60 y.o. male with:    1. Surgery follow-up examination     2. Cervical radiculopathy     3. Hand weakness     4. Hand numbness     5. 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 Cervical myelopathy (HCC)     7. Balance problems     8. Clonus         He is doing well three months out from surgery.  I have no restrictions for him.  He can transition back into normal activities.  I have offered him a referral to occupational therapy.  He would like to work on things on his own at home.  We will plan to follow up with him in 3 months.  I am happy to see him back sooner if he has any issues in between now and then.         In the presence of Marcelline Deist, MD , I have taken down these notes, Mamie Laurel, Scribe. August 17, 2020 9:54 AM      Apolinar Junes B. Lisette Grinder, MD, MPH  Spinal Surgery  [place]. Clydene Pugh MD, [facility]  Nurse: Laverle Patter, BSN, RN, CNOR   [phone]  -  [email or web address]

## 2020-08-17 NOTE — Patient Instructions
It was a pleasure seeing you in clinic today.  Please don't hesitate to call if you have any questions.      Cruze Zingaro BSN, RN, CNOR  Clinical Nurse Coordinator  Dr. Brandon Carlson  [facility]  [facility]  [address]  [place], [place] [address]  [email or web address]  Phone: [phone]  Fax:  [phone]  Scheduling [phone]

## 2020-09-06 ENCOUNTER — Encounter: Admit: 2020-09-06 | Discharge: 2020-09-06 | Payer: MEDICARE

## 2020-09-07 ENCOUNTER — Encounter: Admit: 2020-09-07 | Discharge: 2020-09-07 | Payer: MEDICARE

## 2020-11-08 ENCOUNTER — Encounter: Admit: 2020-11-08 | Discharge: 2020-11-08 | Payer: MEDICARE

## 2020-11-08 DIAGNOSIS — Z09 Encounter for follow-up examination after completed treatment for conditions other than malignant neoplasm: Secondary | ICD-10-CM

## 2020-12-16 ENCOUNTER — Ambulatory Visit: Admit: 2020-12-16 | Discharge: 2020-12-16 | Payer: MEDICARE

## 2020-12-16 ENCOUNTER — Encounter: Admit: 2020-12-16 | Discharge: 2020-12-16 | Payer: MEDICARE

## 2020-12-16 DIAGNOSIS — R399 Unspecified symptoms and signs involving the genitourinary system: Secondary | ICD-10-CM

## 2020-12-16 DIAGNOSIS — K2270 Barrett's esophagus without dysplasia: Secondary | ICD-10-CM

## 2020-12-16 DIAGNOSIS — R350 Frequency of micturition: Principal | ICD-10-CM

## 2020-12-16 DIAGNOSIS — I10 Essential (primary) hypertension: Secondary | ICD-10-CM

## 2020-12-16 DIAGNOSIS — M2550 Pain in unspecified joint: Secondary | ICD-10-CM

## 2020-12-16 DIAGNOSIS — T148XXA Other injury of unspecified body region, initial encounter: Secondary | ICD-10-CM

## 2020-12-16 DIAGNOSIS — M5134 Other intervertebral disc degeneration, thoracic region: Secondary | ICD-10-CM

## 2020-12-16 DIAGNOSIS — M5030 Other cervical disc degeneration, unspecified cervical region: Secondary | ICD-10-CM

## 2020-12-16 DIAGNOSIS — I2510 Atherosclerotic heart disease of native coronary artery without angina pectoris: Secondary | ICD-10-CM

## 2020-12-16 DIAGNOSIS — G061 Intraspinal abscess and granuloma: Secondary | ICD-10-CM

## 2020-12-16 DIAGNOSIS — E785 Hyperlipidemia, unspecified: Secondary | ICD-10-CM

## 2020-12-16 DIAGNOSIS — K219 Gastro-esophageal reflux disease without esophagitis: Secondary | ICD-10-CM

## 2020-12-16 DIAGNOSIS — M4800 Spinal stenosis, site unspecified: Secondary | ICD-10-CM

## 2020-12-16 LAB — PROSTATIC SPECIFIC ANTIGEN-PSA: PROSTATIC SPEC AG: 2.6 ng/mL (ref <3.01)

## 2020-12-16 MED ORDER — TAMSULOSIN 0.4 MG PO CAP
0.4 mg | ORAL_CAPSULE | Freq: Every day | ORAL | 3 refills | 90.00000 days | Status: AC
Start: 2020-12-16 — End: ?

## 2020-12-16 NOTE — Patient Instructions
Call [phone] to schedule ultrasound

## 2020-12-16 NOTE — Progress Notes
PVR= 50ml

## 2020-12-16 NOTE — Assessment & Plan Note
He has had a progressively enlarging left testicular mass for years.  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  He has mild bother associate with this, only with certain positions.  We discussed that the first step would be to obtain ultrasound to ensure that this is a simple cyst.  After that, we would only recommend surgical intervention if it is negatively affecting his quality of life.  Otherwise, these are benign entities and if he is unbothered, this risks of surgery would outweigh the benefits.    - Scrotal ultrasound

## 2020-12-16 NOTE — Progress Notes
Date of Service: 12/16/2020     Subjective:            History of Present Illness  Darin [name] is a 60 y.o. male with a history of HTN, HLD, CAD status post stent in the early 2000 (no longer on anticoagulation), cervical spine injury complicated by abscess in the 1980s, multiple cervical spine surgeries, renal abscess status post IR drainage in 2017, who presents today to discuss longstanding lower urinary tract symptoms.    He has had longstanding lower urinary tract symptoms essentially since his cervical spine abscess in the 1980s.  This resulted in paralysis as well as urinary retention.  Fortunately, he was able to recover his mobility and able to urinate spontaneously again, however he feels that it never back to baseline.  He was evaluated for this by a urologist in [place] in 2017.  He underwent urodynamics which reportedly demonstrated a relatively small capacity bladder and evidence of bladder outlet obstruction.  From report of their cystoscopy, he had some trilobar prostatic hyperplasia.  TRUS measured his prostate at around 40 g at that time.  Per the records, he trialed tamsulosin, but he does not remember ever being on this medication.    He presents today, because over the past 6 months or so he feels that his symptoms have significantly worsened.  His symptoms include urinary hesitancy, intermittency, decreased stream, frequency, and nocturia at least 4 times per night.  He denies any history of hematuria, UTIs, or acute urinary retention.  He denies any personal or family history of prostate cancer.  He does not believe that he has ever had a PSA drawn.    He also reports left-sided testicular swelling that has been present for years and slowly increasing in size.  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  This was resected years ago.  This is somewhat bothersome for him, but generally only causes him discomfort with certain positions.  He says that he does have some tenderness on the right testicle which is new over the past few years.    Medical History:   Diagnosis Date   ? Acid reflux    ? Barrett's esophagus    ? Coronary artery disease    ? Degenerative disc disease, cervical    ? 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 Essential hypertension 09/14/2017   ? Hyperlipemia 09/14/2017   ? 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 Spinal cord abscess    ? Spinal stenosis        Surgical History:   Procedure Laterality Date   ? HX HEART CATHETERIZATION  2003    with stent placement x 1   ? ANTERIOR CERVICAL DISCECTOMY AND FUSION CERVICAL 5-6,  CAGE PLACEMENT CERVICAL 5-6 N/A 05/14/2020    Performed by Marcelline Deist, MD at [facility] OR   ? ANTERIOR INSTRUMENTATION - 2 TO 3 VERTEBRAL SEGMENTS N/A 05/14/2020    Performed by Marcelline Deist, MD at [facility] OR   ? BACLOFEN PUMP IMPLANTATION      since removed   ? BICEPS TENDON REPAIR     ? HX BACK SURGERY     ? HX TONSILLECTOMY     ? LAMINECTOMY     ? SPINAL FUSION      c3-c5 in [place]   ?  UMBILICAL ARTERIAL CATH - BEDSIDE         Family History   Problem Relation Age of Onset   ? Alzheimer's Mother    ? Coronary Artery Disease Father    ? Cancer Father    ? Heart Disease Brother    ? Diabetes Brother    ? Heart problem Brother         Triple bypass   ? None Reported Son    ? None Reported Maternal Grandmother    ? None Reported Maternal Grandfather    ? Cancer-Colon Paternal Grandmother    ? Heart Disease Paternal Grandfather    ? Heart Disease Brother    ? Heart Attack Brother    ? Heart problem Brother    ? Cancer Brother        Current Outpatient Medications   Medication Sig Dispense Refill   ? acetaminophen (TYLENOL EXTRA STRENGTH) 500 mg tablet Take two tablets by mouth every 6 hours as needed for Pain. Max of 4,000 mg of acetaminophen in 24 hours. 90 tablet 0   ? calcium carbonate (TUMS PO) Take 1 tablet by mouth daily.     ? carisoprodoL (SOMA) 350 mg tablet Take 350 mg by mouth three times daily.     ? esomeprazole DR(+) (NEXIUM) 20 mg capsule Take 20 mg by mouth every morning. Take on an empty stomach at least 1 hour before or 2 hours after food.     ? fexofenadine (ALLEGRA) 180 mg tablet Take 180 mg by mouth daily as needed.     ? gabapentin (NEURONTIN) 300 mg capsule Take three capsules by mouth three times daily. 270 capsule 5   ? HYDROcodone/acetaminophen (NORCO) 5/325 mg tablet 2 tablets twice daily       ? tamsulosin (FLOMAX) 0.4 mg capsule Take one capsule by mouth daily. Take 30 min after same meal.  Do not cut/ crush/ chew. 90 capsule 3     No current facility-administered medications for this visit.       Allergies   Allergen Reactions   ? Clonazepam RASH   ? Morphine VOMITING       Social History     Socioeconomic History   ? Marital status: Married   Tobacco Use   ? Smoking status: Never Smoker   ? Smokeless tobacco: Never Used   ? Tobacco comment: nO HX   Vaping Use   ? Vaping Use: Never used   Substance and Sexual Activity   ? Alcohol use: No   ? Drug use: No     Comment: No HX   ? Sexual activity: Yes     Partners: Female     Birth control/protection: None       Review of Systems   HENT: Negative.    Respiratory: Negative.    Cardiovascular: Negative.    Gastrointestinal: Negative.    Endocrine: Negative.    Musculoskeletal: Negative.    Skin: Negative.    Neurological: Negative.    Hematological: Negative.    Psychiatric/Behavioral: Negative.      Objective:         ? acetaminophen (TYLENOL EXTRA STRENGTH) 500 mg tablet Take two tablets by mouth every 6 hours as needed for Pain. Max of 4,000 mg of acetaminophen in 24 hours.   ? calcium carbonate (TUMS PO) Take 1 tablet by mouth daily.   ? carisoprodoL (SOMA) 350 mg tablet Take 350 mg by mouth three times daily.   ? esomeprazole DR(+) (  NEXIUM) 20 mg capsule Take 20 mg by mouth every morning. Take on an empty stomach at least 1 hour before or 2 hours after food.   ? fexofenadine (ALLEGRA) 180 mg tablet Take 180 mg by mouth daily as needed.   ? gabapentin (NEURONTIN) 300 mg capsule Take three capsules by mouth three times daily.   ? HYDROcodone/acetaminophen (NORCO) 5/325 mg tablet 2 tablets twice daily     ? tamsulosin (FLOMAX) 0.4 mg capsule Take one capsule by mouth daily. Take 30 min after same meal.  Do not cut/ crush/ chew.     Vitals:    12/16/20 1119   BP: (!) 158/88   BP Source: Arm, Left Upper   Pulse: 62   Temp: 36.2 ?C (97.2 ?F)   Resp: 14   TempSrc: Temporal   PainSc: Zero   Weight: 98.6 kg (217 lb 6.4 oz)   Height: 172.7 cm (5' 8)     Body mass index is 33.06 kg/m?Marland Kitchen     Constitutional:       Appearance: He is well-developed.   HENT:      Head: Normocephalic and atraumatic.   Eyes:      General: No scleral icterus.     Conjunctiva/sclera: Conjunctivae normal.   Cardiovascular:      Rate and Rhythm: Normal rate.   Pulmonary:      Effort: Pulmonary effort is normal.   Abdominal:      General: There is no distension.      Palpations: Abdomen is soft.      Tenderness: There is no abdominal tenderness.   Genitourinary:     Comments: Prostate 30 to 40 g.  No nodules  Skin:     General: Skin is warm and dry.   Neurological:      Mental Status: He is alert and oriented to person, place, and time.   Psychiatric:         Behavior: Behavior normal.        Assessment and Plan:    Problem   Lower Urinary Tract Symptoms   Testicular Mass     Lower urinary tract symptoms  60 y.o. male with a history of HTN, HLD, CAD status post stent in the early 2000 (no longer on anticoagulation), cervical spine injury complicated by abscess in the 1980s, multiple cervical spine surgeries, renal abscess status post IR drainage in 2017, here for bothersome urinary hesitancy, intermittency, decreased stream, frequency, and nocturia at least 4 times per night.  He was worked up for this by another urologist in [place] in 2017, with some reports of some prostatic hyperplasia on cystoscopy and evidence of bladder outlet obstruction and relatively small capacity bladder on urodynamics.  On exam, his prostate is relatively small, around 30 to 40 g.  He is not currently on any medications for his prostate symptoms.  Discussed options for management of his lower urinary tract symptoms including observation, medications, and surgical interventions.  We discussed that the most reasonable start would be a trial of medications since he is not currently on anything.  He was agreeable to this.  We discussed that should he be refractory to medications and we ever consider surgical intervention, we will first need to complete urodynamics given his complex neurologic history.  Additionally, we discussed that given his symptoms and age, PSA is a consideration.  We discussed the risk, benefits of this and afterwards he agreed that he would like to have his PSA checked.    -  Start tamsulosin 0.4 mg daily  - PSA today  - Follow-up in 6 months    Testicular mass  He has had a progressively enlarging left testicular mass for years.  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  He has mild bother associate with this, only with certain positions.  We discussed that the first step would be to obtain ultrasound to ensure that this is a simple cyst.  After that, we would only recommend surgical intervention if it is negatively affecting his quality of life.  Otherwise, these are benign entities and if he is unbothered, this risks of surgery would outweigh the benefits.    - Scrotal ultrasound    Patient seen and discussed with Dr. Penelope Coop, who directed plan of care.    [place] Stiverson, MD  Urology Resident    Orders Placed This Encounter   ? US SCROTUM CONTENTS   ? PROSTATIC SPECIFIC ANTIGEN-PSA   ? POC URINE DIPSTICK MANUAL READ   ? tamsulosin (FLOMAX) 0.4 mg capsule     ATTESTATION    I personally saw and evaluated the patient and determined the plan of care.  I personally performed the key portions of the E&M visit, discussed the case with the resident, and concur with documentation of history, physical examination, assessment, and treatment plan unless otherwise noted.

## 2020-12-17 ENCOUNTER — Ambulatory Visit: Admit: 2020-12-16 | Discharge: 2020-12-17 | Payer: MEDICARE

## 2020-12-17 DIAGNOSIS — N5089 Other specified disorders of the male genital organs: Secondary | ICD-10-CM

## 2020-12-30 ENCOUNTER — Encounter: Admit: 2020-12-30 | Discharge: 2020-12-30 | Payer: MEDICARE

## 2021-01-30 ENCOUNTER — Encounter: Admit: 2021-01-30 | Discharge: 2021-01-30 | Payer: MEDICARE

## 2021-01-30 MED ORDER — GABAPENTIN 300 MG PO CAP
900 mg | ORAL_CAPSULE | Freq: Three times a day (TID) | ORAL | 5 refills
Start: 2021-01-30 — End: ?

## 2021-02-14 ENCOUNTER — Encounter: Admit: 2021-02-14 | Discharge: 2021-02-14 | Payer: MEDICARE

## 2021-02-14 ENCOUNTER — Ambulatory Visit: Admit: 2021-02-14 | Discharge: 2021-02-14 | Payer: MEDICARE

## 2021-02-14 DIAGNOSIS — G061 Intraspinal abscess and granuloma: Secondary | ICD-10-CM

## 2021-02-14 DIAGNOSIS — K219 Gastro-esophageal reflux disease without esophagitis: Secondary | ICD-10-CM

## 2021-02-14 DIAGNOSIS — M5030 Other cervical disc degeneration, unspecified cervical region: Secondary | ICD-10-CM

## 2021-02-14 DIAGNOSIS — M5134 Other intervertebral disc degeneration, thoracic region: Secondary | ICD-10-CM

## 2021-02-14 DIAGNOSIS — E785 Hyperlipidemia, unspecified: Secondary | ICD-10-CM

## 2021-02-14 DIAGNOSIS — K2270 Barrett's esophagus without dysplasia: Secondary | ICD-10-CM

## 2021-02-14 DIAGNOSIS — M4800 Spinal stenosis, site unspecified: Secondary | ICD-10-CM

## 2021-02-14 DIAGNOSIS — G253 Myoclonus: Secondary | ICD-10-CM

## 2021-02-14 DIAGNOSIS — I2510 Atherosclerotic heart disease of native coronary artery without angina pectoris: Secondary | ICD-10-CM

## 2021-02-14 DIAGNOSIS — T148XXA Other injury of unspecified body region, initial encounter: Secondary | ICD-10-CM

## 2021-02-14 DIAGNOSIS — I10 Essential (primary) hypertension: Secondary | ICD-10-CM

## 2021-02-14 DIAGNOSIS — M2550 Pain in unspecified joint: Secondary | ICD-10-CM

## 2021-02-14 LAB — COMPREHENSIVE METABOLIC PANEL
ALBUMIN: 4.5 g/dL — ABNORMAL LOW (ref 3.5–5.0)
ALT: 38 U/L (ref 7–56)
ANION GAP: 11 K/UL (ref 3–12)
CHLORIDE: 102 MMOL/L (ref 98–110)
CO2: 26 MMOL/L (ref >60)
EGFR: >60 mL/min (ref >60)
GLUCOSE,PANEL: 102 mg/dL — ABNORMAL HIGH (ref 70–100)
POTASSIUM: 4.3 MMOL/L (ref 3.5–5.1)
SODIUM: 139 MMOL/L (ref 137–147)

## 2021-02-14 MED ORDER — GABAPENTIN 300 MG PO CAP
900 mg | ORAL_CAPSULE | Freq: Three times a day (TID) | ORAL | 0 refills | Status: AC
Start: 2021-02-14 — End: ?

## 2021-02-14 NOTE — Patient Instructions
It was a pleasure seeing you in clinic today at the [facility] at [facility].    For questions about your plan of care, don't hesitate to send us a MyChart message or call our nursing line at  [phone].    For questions related to scheduling, please call our scheduling line at [phone].     If you need a refill of your medication, please contact your pharmacy directly or use the Refill Request option on MyChart. Please allow 72 hours for these requests to be completed. Please note that certain medications may require regular clinic visits     For any imaging or labs;  if you're active on MyChart, you will receive a notification once your test results have been reviewed & released by your physician. If you are not active on MyChart, you will receive your results at your next clinic visit. We will call you if there are any urgent findings.

## 2021-02-14 NOTE — Progress Notes
Date of Service: 02/14/2021     Subjective:               Darin [name] is a 60 y.o. male.        History of Present Illness    Our last visit was in December 2021.  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    Last time, I increased gabapentin to 900 mg 3 times daily cautiously because of renal insufficiency.  He did not tolerate clonazepam before.  I considered a retrial of Keppra 500 mg twice daily but he did not benefit from it before.    He refused physical therapy.  I encouraged him to follow-up with Ortho spine surgery to discuss cervical surgery and potential for thoracic intervention.  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    Repeat MRI thoracic spine January 2022: No new or increasing thoracic cord abnormality or focal enhancement.  Persistent central herniation at C5-6 with at least mild central stenosis and contact and slight deformation of the ventral cord.  Unchanged chronic ill-defined cord hyperintensity cephalad to the disc herniation at T5 and associated upper thoracic cord irregularity likely gliosis sequela of previous insult.    Cervical MRI from June 2021: Prior fusion C3-4 and 4-5.  Signal intensity abnormality unchanged posterior cord C4-6 of myelomalacia.  Cervical spondylosis with moderate to marked foraminal stenosis.    He saw Dr. Lisette Grinder of orthopedics in February.  He thought he was doing well postsurgically.  He had C5-6 ACDF and fusion from C3-4 and 4-5.  New instrumentation/cage and fusion at C5-6.    Abnormal movements have improved quite a bit.  Gabapentin has helped but especially with generalized sensation of tingling thru the body.  He denies adverse effect from it.      He is walking okay without decline.  He admits to crunching sensation in the neck with some discomfort.  He was started no Flomax due to urinary hesitancy by PCP.      He denies any new neurologic complaints or concerns.  He discussed thoracic lesion with Dr. Lisette Grinder who did not recommend intervention to him.       Review of Systems    Chief Complaint:  Chief Complaint   Patient presents with   ? Follow Up     Mri results       Past Medical History:  Medical History:   Diagnosis Date   ? Acid reflux    ? Barrett's esophagus    ? Coronary artery disease    ? Degenerative disc disease, cervical    ? 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 Essential hypertension 09/14/2017   ? Hyperlipemia 09/14/2017   ? 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 Spinal cord abscess    ? Spinal stenosis        Surgical History:  Surgical History:   Procedure Laterality Date   ? HX HEART CATHETERIZATION  2003    with stent placement x 1   ? ANTERIOR CERVICAL DISCECTOMY AND FUSION CERVICAL 5-6,  CAGE PLACEMENT CERVICAL 5-6 N/A 05/14/2020    Performed by Marcelline Deist, MD at [facility] OR   ? ANTERIOR INSTRUMENTATION - 2 TO 3 VERTEBRAL SEGMENTS N/A 05/14/2020    Performed by Greer Pickerel  B, MD at [facility] OR   ? BACLOFEN PUMP IMPLANTATION      since removed   ? BICEPS TENDON REPAIR     ? HX BACK SURGERY     ? HX TONSILLECTOMY     ? LAMINECTOMY     ? SPINAL FUSION      c3-c5 in [place]   ? UMBILICAL ARTERIAL CATH - BEDSIDE         Social History:   Social History     Socioeconomic History   ? Marital status: Married   Tobacco Use   ? Smoking status: Never Smoker   ? Smokeless tobacco: Never Used   ? Tobacco comment: nO HX   Vaping Use   ? Vaping Use: Never used   Substance and Sexual Activity   ? Alcohol use: No   ? Drug use: No     Comment: No HX   ? Sexual activity: Yes     Partners: Female     Birth control/protection: None             Family History:  Family History   Problem Relation Age of Onset   ? Alzheimer's Mother    ? Coronary Artery Disease Father    ? Cancer Father    ? Heart Disease Brother    ? Diabetes Brother    ? Heart problem Brother         Triple bypass   ? None Reported Son    ? None Reported Maternal Grandmother    ? None Reported Maternal Grandfather    ? Cancer-Colon Paternal Grandmother    ? Heart Disease Paternal Grandfather    ? Heart Disease Brother    ? Heart Attack Brother    ? Heart problem Brother    ? Cancer Brother        Allergies:  Allergies   Allergen Reactions   ? Clonazepam RASH   ? Morphine VOMITING       Objective:         ? acetaminophen (TYLENOL EXTRA STRENGTH) 500 mg tablet Take two tablets by mouth every 6 hours as needed for Pain. Max of 4,000 mg of acetaminophen in 24 hours.   ? calcium carbonate (TUMS PO) Take 1 tablet by mouth daily.   ? carisoprodoL (SOMA) 350 mg tablet Take 350 mg by mouth three times daily.   ? esomeprazole DR(+) (NEXIUM) 20 mg capsule Take 20 mg by mouth every morning. Take on an empty stomach at least 1 hour before or 2 hours after food.   ? fexofenadine (ALLEGRA) 180 mg tablet Take 180 mg by mouth daily as needed.   ? gabapentin (NEURONTIN) 300 mg capsule Take three capsules by mouth three times daily.   ? HYDROcodone/acetaminophen (NORCO) 5/325 mg tablet 2 tablets twice daily     ? tamsulosin (FLOMAX) 0.4 mg capsule Take one capsule by mouth daily. Take 30 min after same meal.  Do not cut/ crush/ chew.     Vitals:    02/14/21 1307   BP: (!) 140/84   BP Source: Arm, Left Upper   Pulse: 62   Temp: 36.2 ?C (97.2 ?F)   Resp: 14   SpO2: 98%   TempSrc: Temporal   PainSc: Seven   Weight: 96.2 kg (212 lb)   Height: 172.7 cm (5' 8)     Body mass index is 32.23 kg/m?Marland Kitchen       Physical Exam    General: WG/WN,?AAO x4, NAD  HEENT: NC/AT  Cardiac: RRR without murmur  Speech: fluent, no dysarthria or aphasia  CN: PERRL, EOMI,?no facial droop or ptosis, tongue midline, palate midline  Strength:?5/5?SA/EF/EE/WE/FA/TA, 4/4?HF?5-/5-?KE/DF/PF  Prominent action induced positive myoclonus in UEs>LEs, negative myoclonus x4 limbs  Tone - no spasticity in 4 limbs  Sensation: decreased LT RLE?  DTRs: 3/3?in BR/B, 3/3P, 2+/2A,?downgoing?plantar reflexes bilaterally - bilateral global leg clonus  Gait:??no ataxia of primary gait, limp, short stride         Assessment and Plan:    1.  Cervicothoracic myelopathy  2.  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  Chronic cervico-thoracic/segmental spinal myoclonus  4.  Stable abnormal thoracic MRI - cord lesion  5.  Cervical spondylosis s/p cervical fusion    Plan:  1. He is neurologically stable.  Symptoms are better.  He has responded to GPN.    2. Continue GPN 900 TID  3. Check CMP today.  Stable last time (07/2020) with GFR>60.  4. No new meds now.  RTC 1 year.  Sooner prn.  He will call with concerns or changes.  FU ortho spine as scheduled.

## 2021-03-31 ENCOUNTER — Encounter: Admit: 2021-03-31 | Discharge: 2021-03-31 | Payer: MEDICARE

## 2021-03-31 MED ORDER — GABAPENTIN 300 MG PO CAP
900 mg | ORAL_CAPSULE | Freq: Three times a day (TID) | ORAL | 0 refills
Start: 2021-03-31 — End: ?

## 2021-04-21 ENCOUNTER — Encounter: Admit: 2021-04-21 | Discharge: 2021-04-21 | Payer: MEDICARE

## 2021-04-27 ENCOUNTER — Encounter: Admit: 2021-04-27 | Discharge: 2021-04-27 | Payer: MEDICARE

## 2021-04-27 MED ORDER — GABAPENTIN 300 MG PO CAP
900 mg | ORAL_CAPSULE | Freq: Three times a day (TID) | ORAL | 0 refills
Start: 2021-04-27 — End: ?

## 2021-12-17 ENCOUNTER — Encounter: Admit: 2021-12-17 | Discharge: 2021-12-17 | Payer: MEDICARE

## 2021-12-17 MED ORDER — TAMSULOSIN 0.4 MG PO CAP
0.4 mg | ORAL_CAPSULE | Freq: Every day | ORAL | 3 refills
Start: 2021-12-17 — End: ?

## 2022-01-10 ENCOUNTER — Encounter: Admit: 2022-01-10 | Discharge: 2022-01-10 | Payer: MEDICARE

## 2022-01-19 ENCOUNTER — Encounter: Admit: 2022-01-19 | Discharge: 2022-01-19 | Payer: MEDICARE

## 2022-01-19 MED ORDER — TAMSULOSIN 0.4 MG PO CAP
0.4 mg | ORAL_CAPSULE | Freq: Every day | ORAL | 1 refills | 90.00000 days | Status: AC
Start: 2022-01-19 — End: ?

## 2022-02-13 ENCOUNTER — Ambulatory Visit: Admit: 2022-02-13 | Discharge: 2022-02-13 | Payer: MEDICARE

## 2022-02-13 ENCOUNTER — Encounter: Admit: 2022-02-13 | Discharge: 2022-02-13 | Payer: MEDICARE

## 2022-02-13 DIAGNOSIS — K2270 Barrett's esophagus without dysplasia: Secondary | ICD-10-CM

## 2022-02-13 DIAGNOSIS — M5134 Other intervertebral disc degeneration, thoracic region: Secondary | ICD-10-CM

## 2022-02-13 DIAGNOSIS — I2510 Atherosclerotic heart disease of native coronary artery without angina pectoris: Secondary | ICD-10-CM

## 2022-02-13 DIAGNOSIS — M2550 Pain in unspecified joint: Secondary | ICD-10-CM

## 2022-02-13 DIAGNOSIS — M4800 Spinal stenosis, site unspecified: Secondary | ICD-10-CM

## 2022-02-13 DIAGNOSIS — R258 Other abnormal involuntary movements: Secondary | ICD-10-CM

## 2022-02-13 DIAGNOSIS — T148XXA Other injury of unspecified body region, initial encounter: Secondary | ICD-10-CM

## 2022-02-13 DIAGNOSIS — E785 Hyperlipidemia, unspecified: Secondary | ICD-10-CM

## 2022-02-13 DIAGNOSIS — K219 Gastro-esophageal reflux disease without esophagitis: Secondary | ICD-10-CM

## 2022-02-13 DIAGNOSIS — G061 Intraspinal abscess and granuloma: Secondary | ICD-10-CM

## 2022-02-13 DIAGNOSIS — M5030 Other cervical disc degeneration, unspecified cervical region: Secondary | ICD-10-CM

## 2022-02-13 DIAGNOSIS — I10 Essential (primary) hypertension: Secondary | ICD-10-CM

## 2022-02-13 MED ORDER — GABAPENTIN 300 MG PO CAP
900 mg | ORAL_CAPSULE | Freq: Three times a day (TID) | ORAL | 1 refills | Status: AC
Start: 2022-02-13 — End: ?

## 2022-02-13 MED ORDER — GABAPENTIN 300 MG PO CAP
600 mg | ORAL_CAPSULE | Freq: Three times a day (TID) | ORAL | 3 refills | Status: DC
Start: 2022-02-13 — End: 2022-02-13

## 2022-02-13 NOTE — Progress Notes
Date of Service: 02/13/2022     Subjective:                 History of Present Illness    He returns today for cervical thoracic myelopathy with segmental spinal myoclonus.  Last time I continued gabapentin 900 3 times daily.    I tried Keppra previously but he did not well respond to it.  He did not tolerate clonazepam.  He refused physical therapy.    Thoracic MRI January 2022: Persistent herniation T5/6 6 mild central stenosis and contact and slight deformation of the ventral cord.  Ill-defined cord hyperintensity cephalad to disc herniation at T5 and upper thoracic cord irregularity of gliosis or insult    MRI Cspine 2021 - myelomalacia C4-6.  Cervical spondylosis.    He has fluctuation of clonic activity in arms and legs.  It was worse a few months ago but has improved agains.      He has a chest cold.  Feels a little weakner.  No numbness, bowel or bladder changes.  No balance changes.  No other neurologic complaints.      He is satisified with symptom control.  Does not want any med change now.         Chief Complaint:  Chief Complaint   Patient presents with   ? Follow Up     Segmental cord myoclonus       Past Medical History:  Medical History:   Diagnosis Date   ? Acid reflux    ? Barrett's esophagus    ? Coronary artery disease    ? Degenerative disc disease, cervical    ? 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 Essential hypertension 09/14/2017   ? Hyperlipemia 09/14/2017   ? 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 Spinal cord abscess    ? Spinal stenosis        Surgical History:  Surgical History:   Procedure Laterality Date   ? HX HEART CATHETERIZATION  2003    with stent placement x 1   ? ANTERIOR CERVICAL DISCECTOMY AND FUSION CERVICAL 5-6,  CAGE PLACEMENT CERVICAL 5-6 N/A 05/14/2020    Performed by Marcelline Deist, MD at [facility] OR   ? ANTERIOR INSTRUMENTATION - 2 TO 3 VERTEBRAL SEGMENTS N/A 05/14/2020    Performed by Marcelline Deist, MD at [facility] OR   ? BACLOFEN PUMP IMPLANTATION      since removed   ? BICEPS TENDON REPAIR     ? HX BACK SURGERY     ? HX TONSILLECTOMY     ? LAMINECTOMY     ? SPINAL FUSION      c3-c5 in [place]   ? UMBILICAL ARTERIAL CATH - BEDSIDE         Social History:   Social History     Socioeconomic History   ? Marital status: Married   Tobacco Use   ? Smoking status: Never   ? Smokeless tobacco: Never   ? Tobacco comments:     nO HX   Vaping Use   ? Vaping Use: Never used   Substance and Sexual Activity   ? Alcohol use: No   ? Drug use: No     Comment: No HX   ? Sexual activity: Yes     Partners: Female     Birth control/protection: None  Family History:  Family History   Problem Relation Age of Onset   ? Alzheimer's Mother    ? Coronary Artery Disease Father    ? Cancer Father    ? Heart Disease Brother    ? Diabetes Brother    ? Heart problem Brother         Triple bypass   ? None Reported Son    ? None Reported Maternal Grandmother    ? None Reported Maternal Grandfather    ? Cancer-Colon Paternal Grandmother    ? Heart Disease Paternal Grandfather    ? Heart Disease Brother    ? Heart Attack Brother    ? Heart problem Brother    ? Cancer Brother        Allergies:  Allergies   Allergen Reactions   ? Clonazepam RASH   ? Morphine VOMITING       Objective:         ? acetaminophen (TYLENOL EXTRA STRENGTH) 500 mg tablet Take two tablets by mouth every 6 hours as needed for Pain. Max of 4,000 mg of acetaminophen in 24 hours.   ? calcium carbonate (TUMS PO) Take 1 tablet by mouth daily.   ? carisoprodoL (SOMA) 350 mg tablet Take one tablet by mouth three times daily.   ? esomeprazole DR(+) (NEXIUM) 20 mg capsule Take one capsule by mouth every morning. Take on an empty stomach at least 1 hour before or 2 hours after food.   ? fexofenadine (ALLEGRA) 180 mg tablet Take one tablet by mouth daily as needed.   ? gabapentin (NEURONTIN) 300 mg capsule TAKE THREE CAPSULES BY MOUTH THREE TIMES DAILY.   ? HYDROcodone/acetaminophen (NORCO) 5/325 mg tablet two tablets twice daily.   ? tamsulosin (FLOMAX) 0.4 mg capsule TAKE ONE CAPSULE BY MOUTH DAILY. TAKE 30 MIN AFTER SAME MEAL. DO NOT CUT/ CRUSH/ CHEW.     Vitals:    02/13/22 1303   BP: 127/77   Pulse: 65   Temp: 36.4 ?C (97.6 ?F)   SpO2: 98%   PainSc: Eight  Comment: all over   Weight: 99.8 kg (220 lb)   Height: 172.7 cm (5' 8)     Body mass index is 33.45 kg/m?Marland Kitchen       Physical Exam    General: WG/WN,?AAO x4, NAD  HEENT: NC/AT  Cardiac: RRR without murmur  Speech: fluent, no dysarthria or aphasia  CN: PERRL, EOMI,?no facial droop or ptosis, tongue midline, palate midline  Strength:?5/5?SA/EF/EE/WE/FA/TA, 5/5?HF/KE/DF/PF  Prominent action induced positive and negative 4 limb clonus x4 limbs  Tone - no spasticity in 4 limbs  Sensation:?increase LT perceptiomn legs>arms - no sensory level?  DTRs: 3/3?in BR/B, 3/3P,?3/3A,?downgoing?plantar reflexes bilaterally?- bilateral global clonus  Gait:??wide based, everted feet, no ataxia of primary gait  ?       Assessment and Plan:    1. Cervicothoracic myelopathy  2. 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 Cervical spondylosis s/p fusion    Plan:  1. Patient does not want to add/change meds.  Satisfied with symptom mgmt now  2. Refill Gabapentin 900 TID  3. Did not tolerated Clonazepam.  Keppra ineffective.    4. DC to PCP.  Neurologically stable.  Asked him to call mne with change/worsening and I'll see again prn. Has don PT before- declines retrial PT

## 2022-04-03 ENCOUNTER — Encounter: Admit: 2022-04-03 | Discharge: 2022-04-03 | Payer: MEDICARE

## 2022-04-04 ENCOUNTER — Encounter: Admit: 2022-04-04 | Discharge: 2022-04-04 | Payer: MEDICARE

## 2022-04-04 NOTE — Telephone Encounter
Left message for Caryl Pina at Dr Golden Hurter office to [facility] over CT C/A/P. Vonzell Schlatter, RN

## 2022-04-06 ENCOUNTER — Encounter: Admit: 2022-04-06 | Discharge: 2022-04-06 | Payer: MEDICARE

## 2022-04-06 ENCOUNTER — Ambulatory Visit: Admit: 2022-04-06 | Discharge: 2022-04-06 | Payer: MEDICARE

## 2022-04-06 DIAGNOSIS — R918 Other nonspecific abnormal finding of lung field: Secondary | ICD-10-CM

## 2022-04-06 MED ORDER — LACTATED RINGERS IV SOLP
INTRAVENOUS | 0 refills
Start: 2022-04-06 — End: ?

## 2022-04-06 NOTE — Telephone Encounter
Spoke with patient regarding pulmonary procedure, COVID testing, pre-procedure requirements and instructions.  All information that we have gone over today will also be sent to patient via their MyChart account. Patient understands that they will be able to access this information which will also have my telephone number should they have any further questions or concerns, as well as contacting me via a MyChart message.  Patient has been informed that mandatory COVID testing is no longer required prior to having the procedure.  Patient has been informed of the risk factors of not having the COVID test prior to the procedure and verbalizes knowledge of these risk factors.  Patient  has verbalized understanding of all instructions given to them today and all questions have been answered. Patient is scheduled for [facility] EBUS  on 04/28/22 at 1415 with Dr. Loni Muse .  Vonzell Schlatter, RN

## 2022-04-18 ENCOUNTER — Encounter: Admit: 2022-04-18 | Discharge: 2022-04-18 | Payer: MEDICARE

## 2022-04-18 ENCOUNTER — Ambulatory Visit: Admit: 2022-04-18 | Discharge: 2022-04-18 | Payer: MEDICARE

## 2022-04-18 DIAGNOSIS — R258 Other abnormal involuntary movements: Secondary | ICD-10-CM

## 2022-04-18 DIAGNOSIS — R918 Other nonspecific abnormal finding of lung field: Secondary | ICD-10-CM

## 2022-04-18 LAB — CBC AND DIFF
ABSOLUTE BASO COUNT: 0 K/UL (ref 0–0.20)
ABSOLUTE EOS COUNT: 0 K/UL (ref 0–0.45)
ABSOLUTE LYMPH COUNT: 2.1 K/UL (ref 1.0–4.8)
ABSOLUTE MONO COUNT: 0.4 K/UL (ref 0–0.80)
ABSOLUTE NEUTROPHIL: 1.4 K/UL — ABNORMAL LOW (ref 1.8–7.0)
BASOPHILS %: 1 % (ref 0–2)
EOSINOPHILS %: 1 % (ref 0–5)
HEMATOCRIT: 45 % (ref 40–50)
MCH: 29 pg (ref 26–34)
MCHC: 34 g/dL (ref 32.0–36.0)
MCV: 85 FL (ref 80–100)
MONOCYTES %: 11 % (ref 4–12)
MPV: 7.6 FL (ref 7–11)
NEUTROPHILS %: 36 % — ABNORMAL LOW (ref 41–77)
PLATELET COUNT: 201 K/UL (ref 150–400)
RBC COUNT: 5.3 M/UL — ABNORMAL HIGH (ref 4.4–5.5)
RDW: 14 % (ref 11–15)
WBC COUNT: 4.1 K/UL — ABNORMAL LOW (ref 4.5–11.0)

## 2022-04-18 LAB — COMPREHENSIVE METABOLIC PANEL
CALCIUM: 9.3 mg/dL (ref 8.5–10.6)
CHLORIDE: 104 MMOL/L (ref 98–110)
GLUCOSE,PANEL: 91 mg/dL (ref 70–100)
POTASSIUM: 4.2 MMOL/L (ref 3.5–5.1)
SODIUM: 138 MMOL/L (ref 137–147)

## 2022-04-28 ENCOUNTER — Ambulatory Visit: Admit: 2022-04-28 | Discharge: 2022-04-28 | Payer: MEDICARE

## 2022-04-28 ENCOUNTER — Encounter: Admit: 2022-04-28 | Discharge: 2022-04-28 | Payer: MEDICARE

## 2022-04-28 DIAGNOSIS — I2510 Atherosclerotic heart disease of native coronary artery without angina pectoris: Secondary | ICD-10-CM

## 2022-04-28 DIAGNOSIS — M5030 Other cervical disc degeneration, unspecified cervical region: Secondary | ICD-10-CM

## 2022-04-28 DIAGNOSIS — T148XXA Other injury of unspecified body region, initial encounter: Secondary | ICD-10-CM

## 2022-04-28 DIAGNOSIS — M2550 Pain in unspecified joint: Secondary | ICD-10-CM

## 2022-04-28 DIAGNOSIS — K2270 Barrett's esophagus without dysplasia: Secondary | ICD-10-CM

## 2022-04-28 DIAGNOSIS — M5134 Other intervertebral disc degeneration, thoracic region: Secondary | ICD-10-CM

## 2022-04-28 DIAGNOSIS — I10 Essential (primary) hypertension: Secondary | ICD-10-CM

## 2022-04-28 DIAGNOSIS — G061 Intraspinal abscess and granuloma: Secondary | ICD-10-CM

## 2022-04-28 DIAGNOSIS — M4800 Spinal stenosis, site unspecified: Secondary | ICD-10-CM

## 2022-04-28 DIAGNOSIS — E785 Hyperlipidemia, unspecified: Secondary | ICD-10-CM

## 2022-04-28 DIAGNOSIS — K219 Gastro-esophageal reflux disease without esophagitis: Secondary | ICD-10-CM

## 2022-04-28 MED ORDER — ROCURONIUM 10 MG/ML IV SOLN
INTRAVENOUS | 0 refills | Status: DC
Start: 2022-04-28 — End: 2022-04-28

## 2022-04-28 MED ORDER — DEXAMETHASONE SODIUM PHOSPHATE 4 MG/ML IJ SOLN
INTRAVENOUS | 0 refills | Status: DC
Start: 2022-04-28 — End: 2022-04-28

## 2022-04-28 MED ORDER — PROPOFOL 10 MG/ML IV EMUL 100 ML (INFUSION)(AM)(OR)
INTRAVENOUS | 0 refills | Status: DC
Start: 2022-04-28 — End: 2022-04-28

## 2022-04-28 MED ORDER — FENTANYL CITRATE (PF) 50 MCG/ML IJ SOLN
INTRAVENOUS | 0 refills | Status: DC
Start: 2022-04-28 — End: 2022-04-28

## 2022-04-28 MED ORDER — ARTIFICIAL TEARS (PF) SINGLE DOSE DROPS GROUP
OPHTHALMIC | 0 refills | Status: DC
Start: 2022-04-28 — End: 2022-04-28

## 2022-04-28 MED ORDER — SUGAMMADEX 100 MG/ML IV SOLN
INTRAVENOUS | 0 refills | Status: DC
Start: 2022-04-28 — End: 2022-04-28

## 2022-04-28 MED ORDER — ONDANSETRON HCL (PF) 4 MG/2 ML IJ SOLN
INTRAVENOUS | 0 refills | Status: DC
Start: 2022-04-28 — End: 2022-04-28

## 2022-04-28 MED ORDER — PROPOFOL INJ 10 MG/ML IV VIAL
INTRAVENOUS | 0 refills | Status: DC
Start: 2022-04-28 — End: 2022-04-28

## 2022-04-28 MED ORDER — LIDOCAINE (PF) 200 MG/10 ML (2 %) IJ SYRG
INTRAVENOUS | 0 refills | Status: DC
Start: 2022-04-28 — End: 2022-04-28

## 2022-04-28 MED ADMIN — LACTATED RINGERS IV SOLP [4318]: 1000.000 mL | INTRAVENOUS | @ 17:00:00 | Stop: 2022-04-28 | NDC 00338011704
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
## 2022-04-28 NOTE — Anesthesia Post-Procedure Evaluation
Post-Anesthesia Evaluation    Name: Darin [name]      MRN: [id number]     DOB: 1960-07-12     Age: 61 y.o.     Sex: male   __________________________________________________________________________     Procedure Information     Anesthesia Start Date/Time: 04/28/22 1348    Procedures:       ROBOT ASSISTED FLEXIBLE  BRONCHOSCOPY WITH IMAGE-GUIDED NAVIGATION- FLEXIBLE ([place]) - 2hr. request 3-D C-Arm      BRONCHOSCOPY DIAGNOSTIC WITH/ WITHOUT CELL WASHING - FLEXIBLE ([place])      BRONCHOSCOPY WITH TRANSBRONCHIAL LUNG BIOPSY - FLEXIBLE - SINGLE LOBE ([place])      BRONCHOSCOPY WITH ENDOBRONCHIAL ULTRASOUND GUIDED TRANSTRACHEAL/ TRANSBRONCHIAL SAMPLING - 3 OR MORE MEDIASTINAL/ HILAR LYMPH NODE STATIONS/ STRUCTURE - FLEXIBLE ([place])    Location: PULM ROOM / Main OR/Periop    Surgeons: Tacey Heap, Joycelyn Man, MD          Post-Anesthesia Vitals      Vitals Value Taken Time   BP 137/90 04/28/22 1500   Temp 36.5 C (97.7 F) 04/28/22 1500   Pulse 74 04/28/22 1500   Respirations 9 PER MINUTE 04/28/22 1500   SpO2 92 % 04/28/22 1500   O2 Device None (Room air) 04/28/22 1500   ABP     ART BP           Post Anesthesia Evaluation Note    Evaluation location: Pre/Post  Patient participation: recovered; patient participated in evaluation  Level of consciousness: alert    Pain score: 0  Pain management: adequate    Hydration: normovolemia  Temperature: 36.0C - 38.4C  Airway patency: adequate    Perioperative Events       Post-op nausea and vomiting: no PONV    Postoperative Status  Cardiovascular status: hemodynamically stable  Respiratory status: spontaneous ventilation        Perioperative Events  There were no known notable events for this encounter.

## 2022-04-28 NOTE — Anesthesia Procedure Notes
Procedure: Airway Placement    AIRWAY INSERTION    Date/Time: 04/28/2022 1:56 PM    Patient location: OR  Urgency: elective  Difficult Airway: No            Airway Procedure  Indication(s) for airway management: surgery        no    Preoxygenated: yes  Patient position: sniffing    Mask difficulty assessment: 2 - vent by mask + OA or adjuvant +/- NMBA      Procedure Outcome  Final airway type: endotracheal airway  Endotracheal airway: ETT          ETT size (mm): 8.5  Technique used for successful ETT placement: video laryngoscopy  Devices/methods used in placement: intubating stylet  Insertion site: oral  Blade type: Glide Scope   Laryngoscope/Videolaryngoscope blade size: 4  Cormack-Lehane classification: grade I - full view of glottis      Measured from: gums (21)  Number of attempts at approach: 1  Placement verified by auscultation, bronchoscopy and capnometry            Complications  Cardiovascular:   Pulmonary:   Procedure: airway not difficult  Medication:   Additional notes: Atraumatic. Dentition and mucosa unchanged.      Performed by: Rutherford Guys, CRNA  Authorized by: Kathrin Greathouse, MD

## 2022-04-30 ENCOUNTER — Encounter: Admit: 2022-04-30 | Discharge: 2022-04-30 | Payer: MEDICARE

## 2022-04-30 DIAGNOSIS — M5134 Other intervertebral disc degeneration, thoracic region: Secondary | ICD-10-CM

## 2022-04-30 DIAGNOSIS — T148XXA Other injury of unspecified body region, initial encounter: Secondary | ICD-10-CM

## 2022-04-30 DIAGNOSIS — G061 Intraspinal abscess and granuloma: Secondary | ICD-10-CM

## 2022-04-30 DIAGNOSIS — E785 Hyperlipidemia, unspecified: Secondary | ICD-10-CM

## 2022-04-30 DIAGNOSIS — M2550 Pain in unspecified joint: Secondary | ICD-10-CM

## 2022-04-30 DIAGNOSIS — I2510 Atherosclerotic heart disease of native coronary artery without angina pectoris: Secondary | ICD-10-CM

## 2022-04-30 DIAGNOSIS — I10 Essential (primary) hypertension: Secondary | ICD-10-CM

## 2022-04-30 DIAGNOSIS — M5030 Other cervical disc degeneration, unspecified cervical region: Secondary | ICD-10-CM

## 2022-04-30 DIAGNOSIS — K2270 Barrett's esophagus without dysplasia: Secondary | ICD-10-CM

## 2022-04-30 DIAGNOSIS — M4800 Spinal stenosis, site unspecified: Secondary | ICD-10-CM

## 2022-04-30 DIAGNOSIS — K219 Gastro-esophageal reflux disease without esophagitis: Secondary | ICD-10-CM

## 2022-05-02 ENCOUNTER — Encounter: Admit: 2022-05-02 | Discharge: 2022-05-02 | Payer: MEDICARE

## 2022-05-02 DIAGNOSIS — C7A8 Other malignant neuroendocrine tumors: Secondary | ICD-10-CM

## 2022-05-03 ENCOUNTER — Encounter: Admit: 2022-05-03 | Discharge: 2022-05-03 | Payer: MEDICARE

## 2022-05-03 NOTE — Telephone Encounter
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 All of his quesitons were answered. He appreciated the phone call. I placed all the orders.     Tim Lair MBBS  Pulmonary and Critical Care Fellow

## 2022-05-04 ENCOUNTER — Encounter: Admit: 2022-05-04 | Discharge: 2022-05-04 | Payer: MEDICARE

## 2022-05-09 ENCOUNTER — Encounter: Admit: 2022-05-09 | Discharge: 2022-05-09 | Payer: MEDICARE

## 2022-05-09 ENCOUNTER — Ambulatory Visit: Admit: 2022-05-09 | Discharge: 2022-05-09 | Payer: MEDICARE

## 2022-05-09 DIAGNOSIS — R079 Chest pain, unspecified: Secondary | ICD-10-CM

## 2022-05-09 DIAGNOSIS — R0602 Shortness of breath: Secondary | ICD-10-CM

## 2022-05-11 ENCOUNTER — Ambulatory Visit: Admit: 2022-05-11 | Discharge: 2022-05-11 | Payer: MEDICARE

## 2022-05-11 ENCOUNTER — Encounter: Admit: 2022-05-11 | Discharge: 2022-05-11 | Payer: MEDICARE

## 2022-05-11 DIAGNOSIS — C7A8 Other malignant neuroendocrine tumors: Secondary | ICD-10-CM

## 2022-05-11 DIAGNOSIS — D3A8 Other benign neuroendocrine tumors: Secondary | ICD-10-CM

## 2022-05-11 DIAGNOSIS — R911 Solitary pulmonary nodule: Secondary | ICD-10-CM

## 2022-05-16 ENCOUNTER — Encounter: Admit: 2022-05-16 | Discharge: 2022-05-16 | Payer: MEDICARE

## 2022-05-16 ENCOUNTER — Ambulatory Visit: Admit: 2022-05-16 | Discharge: 2022-05-16 | Payer: MEDICARE

## 2022-05-16 DIAGNOSIS — R0602 Shortness of breath: Secondary | ICD-10-CM

## 2022-05-17 ENCOUNTER — Encounter: Admit: 2022-05-17 | Discharge: 2022-05-17 | Payer: MEDICARE

## 2022-05-17 DIAGNOSIS — C7A8 Other malignant neuroendocrine tumors: Secondary | ICD-10-CM

## 2022-05-17 MED ORDER — RP DX COPPER CU-64 DOTATATE MCI
4 | Freq: Once | INTRAVENOUS | 0 refills | Status: CP
Start: 2022-05-17 — End: ?
  Administered 2022-05-17: 20:00:00 4.5 via INTRAVENOUS

## 2022-05-17 NOTE — Progress Notes
Date of Service: 05/18/2022    Name: Darin [name] [facility]          DOB: [birth year]          MRN: [id number]    Referring Provider:  Dr. Laray Anger  Neurologist:Dr. Tandy Gaw  PCP: Dr. Ozzie Hoyle (outside records)   Chief Complaint   Patient presents with   ? New Patient       History of Present Illness  Darin [name] is a 61 y.o. 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     Contact Summary:  Patient is a 61 year old with a history of Barrett's esophagus, GERD, CAD, HTN, hyperlipidemia, and  Cervicothoracic myelopathy. Having some shortness of breath, went in for a stress test, and then he was sick with the flu and it did not go away. Went in for imaging that showed a lung nodule and biopsy revealed a carcinoid tumor.   ?  ?  03/14/2022-CT C/A/P: RML nodule measuring 2.1 x 2 cm  ?  03/30/2022-PET([facility]): Right hilar nodule with SUV 10.2.  ?    04/28/2022-CT Chest wo: Stable lobulated right middle lobe nodule with endobronchial component, likely primary lung cancer. Carcinoid tumor is an additional possibility.  ?  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 Carcinoid tumor is an additional possibility.   Correlate with same day biopsy.     Few other small lung nodules are stable including a subpleural nodule in   the right middle lobe which demonstrated no significant FDG avidity on   external PET/CT and may be a subpleural lymph node. Recommend attention on   follow-up to ensure stability.     No lymphadenopathy.     Moderate coronary artery calcification.     04/28/2022-EBUS: A. RML nodule cryo biopsy: ?   Carcinoid tumor, NOS / Neuroendocrine tumor, NOS.  ?  ########################################################################   Final Diagnosis:   A. Lung ?RML, ION-guided FNA: ?   Well-differentiated neuroendocrine tumor. See comment.   Please also see concurrent surgical pathology report ([id number]).     B. Lymph Node - S7, EBUS FNA: ?   Polymorphous population of lymphocytes. No carcinoma identified in   the sampled specimen. ?       05/09/22 2D echo  ?  Left Ventricle: The left ventricular size is normal. Concentric remodeling. The left ventricular systolic function is normal. The visually estimated ejection fraction is 60%. There are no segmental wall motion abnormalities. Normal left ventricular diastolic function. Normal left atrial pressure.  ?  Right Ventricle: The right ventricular size is normal. The right ventricular systolic function is normal.  ?  Left Atrium: Normal size. Right Atrium: Normal size.  ?  No hemodynamically significant valvular abnormalities.    05/11/2022-PFT  ?  FEV1-%Pred-Pre 108    %     DLCOunc-%Pred-Pre 100    %       05/16/2022 Cardiac Stress test - negative for ischemic changes    05/17/2022-Neuroendocrine PET Impression:  ?Radiotracer avid right middle lobe nodule, which is stable in size since prior CT examination inconsistent with patient's reported neuroendocrine tumor.  ?  No radiotracer avid nodal or distant metastatic disease.  ?  Heterogeneous uptake within the prostate, which is favored to represent benign prostatic hypertrophy, underlying prostate neoplasm is felt less likely.  ?  By my electronic signature, I attest that I have personally reviewed the  images for this examination and formulated the interpretations and opinions expressed in this report  ?  He is here today with his wife and son to discuss surgical options. He has continued to have shortness of breath, and cough. He denies fevers, chills, body aches, nausea, vomiting, hemoptysis or weight loss. He does not smoke or drink alcohol. 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       History   Medical History:   Diagnosis Date   ? Acid reflux    ? Barrett's esophagus    ? Coronary artery disease    ? Degenerative disc disease, cervical    ? 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 Essential hypertension 09/14/2017   ? Hyperlipemia 09/14/2017   ? 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 Spinal cord abscess    ? Spinal stenosis      Surgical History:   Procedure Laterality Date   ? HX HEART CATHETERIZATION  2003    with stent placement x 1 on left side   ? ANTERIOR CERVICAL DISCECTOMY AND FUSION CERVICAL 5-6,  CAGE PLACEMENT CERVICAL 5-6 N/A 05/14/2020    Performed by Marcelline Deist, MD at [facility] OR   ? ANTERIOR INSTRUMENTATION - 2 TO 3 VERTEBRAL SEGMENTS N/A 05/14/2020    Performed by Marcelline Deist, MD at [facility] OR   ? ROBOT ASSISTED FLEXIBLE  BRONCHOSCOPY WITH IMAGE-GUIDED NAVIGATION- FLEXIBLE N/A 04/28/2022    Performed by Boykin Nearing, Sherryl Manges, MD at [facility] OR   ? BRONCHOSCOPY DIAGNOSTIC WITH/ WITHOUT CELL WASHING - FLEXIBLE N/A 04/28/2022    Performed by Boykin Nearing, Sherryl Manges, MD at [facility] OR   ? BRONCHOSCOPY WITH TRANSBRONCHIAL LUNG BIOPSY - FLEXIBLE - SINGLE LOBE N/A 04/28/2022    Performed by Boykin Nearing, Maykol Elvera Lennox, MD at [facility] OR   ? BRONCHOSCOPY WITH ENDOBRONCHIAL ULTRASOUND GUIDED TRANSTRACHEAL/ TRANSBRONCHIAL SAMPLING - 3 OR MORE MEDIASTINAL/ HILAR LYMPH NODE STATIONS/ STRUCTURE - FLEXIBLE N/A 04/28/2022    Performed by Boykin Nearing, Maykol Elvera Lennox, MD at [facility] OR   ? BACLOFEN PUMP IMPLANTATION      since removed   ? BICEPS TENDON REPAIR     ? HX BACK SURGERY     ? HX TONSILLECTOMY     ? LAMINECTOMY     ? SPINAL FUSION      c3-c5 in [place]   ? UMBILICAL ARTERIAL CATH - BEDSIDE       Social History     Socioeconomic History   ? Marital status: Married   Tobacco Use   ? Smoking status: Never   ? Smokeless tobacco: Never   ? Tobacco comments:     nO HX   Vaping Use   ? Vaping Use: Never used   Substance and Sexual Activity   ? Alcohol use: No   ? Drug use: No     Comment: No HX   ? Sexual activity: Yes     Partners: Female     Birth control/protection: None     Vaping/E-liquid Use   ? Vaping Use Never User               Family History   Problem Relation Age of Onset   ? Alzheimer's Mother    ? Coronary Artery Disease Father    ? Cancer Father    ?  Heart Disease Brother    ? Diabetes Brother    ? Heart problem Brother         Triple bypass   ? None Reported Son    ? None Reported Maternal Grandmother    ? None Reported Maternal Grandfather    ? Cancer-Colon Paternal Grandmother    ? Heart Disease Paternal Grandfather    ? Heart Disease Brother    ? Heart Attack Brother    ? Heart problem Brother    ? Cancer Brother      Allergies   Allergen Reactions   ? Clonazepam RASH   ? Morphine VOMITING     Immunization History   Administered Date(s) Administered   ? COVID-19 [facility] & JOHNSON/JANSSEN) recombinant protein vacc, 0.5 mL (PF) 10/02/2019, 04/24/2020   ? COVID-19 (PFIZER tris-sucrose) mRNA vacc, 30 mcg/0.3 mL (PF) 10/07/2020   ? COVID-19 Bivalent (61YR+)(PFIZER), mRNA vacc, 80mcg/0.3mL 04/02/2021   ? Covid-19 mRNA Vaccine >=12yo (Moderna)(2023-24 formula)(Spikevax) 04/29/2022   ? Flu Vaccine Quadrivalent =>3 Yo (Preservative Free) 04/02/2021, 04/29/2022   ? Tdap Vaccine 03/12/2020         Medications:   ? acetaminophen (TYLENOL EXTRA STRENGTH) 500 mg tablet Take two tablets by mouth every 6 hours as needed for Pain. Max of 4,000 mg of acetaminophen in 24 hours.   ? albuterol sulfate (PROAIR HFA) 90 mcg/actuation HFA aerosol inhaler Inhale 2 puffs by mouth every 4-6 hours AS NEEDED SHORTNESS OF Zella Ball [name]   ? calcium carbonate (TUMS PO) Take 1 tablet by mouth daily.   ? carisoprodoL (SOMA) 350 mg tablet Take one tablet by mouth three times daily.   ? esomeprazole DR(+) (NEXIUM) 20 mg capsule Take one capsule by mouth every morning. Take on an empty stomach at least 1 hour before or 2 hours after food.   ? fexofenadine (ALLEGRA) 180 mg tablet Take one tablet by mouth daily as needed.   ? gabapentin (NEURONTIN) 300 mg capsule Take three capsules by mouth three times daily.   ? HYDROcodone/acetaminophen (NORCO) 5/325 mg tablet two tablets twice daily.   ? tamsulosin (FLOMAX) 0.4 mg capsule TAKE ONE CAPSULE BY MOUTH DAILY. TAKE 30 MIN AFTER SAME MEAL. DO NOT CUT/ CRUSH/ CHEW.       I have reviewed the PTA meds and they are correct.    Anti-coagulation: none     Functional Assessment   ECOG Performance Status: 1, Restricted in physically strenuous activity but ambulatory and able to carry out work of a light or sedentary nature, e.g., light house work, office work   Pain Scale (0 = No Pain, 10 = Severe Pain): 0  Comments/Interventions: none    Clinical Nutrition Status  PO Intake compared to normal: Normal  Weight loss last 3 months: 0  Estimated body mass index is 33.86 kg/m? as calculated from the following:    Height as of this encounter: 172.7 cm (5' 8).    Weight as of this encounter: 101 kg (222 lb 11.2 oz).  BMI class: Obesity 1 (30  to <35)  Patient demonstrates no malnutrition      ROS  Constitutional: Negative.   HENT: Negative.    Eyes: Negative.    Cardiovascular: Negative.    Respiratory: Negative.    Endocrine: Negative.    Hematologic/Lymphatic: Negative.    Skin: Negative.    Musculoskeletal: Negative.    Gastrointestinal: Negative.    Genitourinary: Negative.    Neurological: Negative.    Psychiatric/Behavioral: Negative.  Allergic/Immunologic: Negative.    ?  ?  Review of systems Obtained from patient    Physical Exam   Vitals reviewed.  HENT:   Head: Normocephalic and atraumatic.   Cardiovascular: Normal rate, regular rhythm and normal heart sounds.   Pulmonary/Chest: Effort normal and breath sounds normal.   Abdominal: Soft. Normal appearance.   Musculoskeletal:         General: Normal range of motion.      Cervical back: Normal range of motion.   Neurological: He is alert and oriented to person, place, and time.   Skin: Skin is warm. Capillary refill takes less than 2 seconds.   Psychiatric: Mood, judgment and thought content normal.       Objective  Vitals:    05/18/22 1457   BP: 136/83   Pulse: 73   SpO2: 98%         Primary Diagnosis:   Encounter Diagnoses   Name Primary?   ? Neuroendocrine tumor Yes   ? Right middle lobe pulmonary nodule          Currently Active Problems:  Patient Active Problem List    Diagnosis Date Noted   ? Neuroendocrine tumor 05/11/2022   ? Right middle lobe pulmonary nodule 05/11/2022   ? Lower urinary tract symptoms 12/16/2020   ? 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 Cervical stenosis of spine 05/14/2020   ? Spasticity 06/17/2019   ? 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 Gait abnormality 05/13/2019   ? Dysesthesia 07/05/2018   ? 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 Cervical myelopathy (HCC) 10/12/2017   ? Dyspnea 09/18/2017   ? CAD (coronary artery disease) 09/14/2017   ? Hyperlipemia 09/14/2017   ? Essential hypertension 09/14/2017         Assessment and Plan  1. Neuroendocrine tumor    2. Right middle lobe pulmonary nodule        Dr. Particia Nearing has reviewed this patient's most recent procedures and history and does feel that surgery is warranted. He will undergo a robot assisted thoracoscopic right middle lobectomyt for his carcinoid tumoe. The entire procedure was discussed at length including risks, benefits, alternatives and possible complications. [facility] understands, accepts and wishes to proceed. He will need PAT prior to surgery and this was ordered today. Thank you very much for allowing Darin [name] to participate in this pleasant patient's care. We will keep you apprised of his progress. Please do not hesitate to contact Darin [name] with any questions or concerns.    Robyn Crane-McCallister, APRN, FNP-C       Attending:  I had the pleasure of seeing Mr. Gurkin today in thoracic surgery clinic.  As you are aware he is a 61 year old gentleman with biopsy-proven right middle lobe carcinoid tumor which measured up to 2.1 cm in size.  Patient presents today for surgical evaluation.  Prior to leaving clinic the patient had pulmonary function test demonstrating FEV1 108% and DLCO 100%.  Patient did undergo an endobronchial ultrasound with negative lymph nodes.  Patient did also undergo stress test which was negative for any ischemia.  Patient denies any hemoptysis, bone pain, headaches, or other constitutional symptoms associate with this lesion.  Patient does have a past medical history significant for testicular mass, cervical stenosis of the spine, spasticity, action induced myoclonus, coronary artery disease, hyperlipidemia, and hypertension.  Patient is not on any blood thinners.    On physical exam he is 5 feet 8 inches weighing under 1 kg  giving him a BMI of 33.9.  Remainder physical exam per nurse practitioner.    Today we discussed the risks (Bleeding, infection, arrhythmias, damage to any surrounding nerves/vessels/tissues that may require additional procedures or reoperation, chylothorax, acute respiratory failure, prolonged intubation, prolonged air leak, chronic pain, and/or anastomotic leak.  Remote chance of paralysis, cardiac arrest, stroke, and/or death. Risks of blood transfusion and adverse reactions were discussed with the patient.), benefits, and alternatives associate with robotic assisted right middle lobectomy.  Patient is agreed to proceed with surgery on June 09, 2022.  Prior to surgical intervention patient will need to see our preoperative anesthesia screening.  I appreciate the opportunity participate in his care.  Please feel free to contact my office with any questions or concerns.    Particia Nearing, MD

## 2022-05-18 ENCOUNTER — Encounter: Admit: 2022-05-18 | Discharge: 2022-05-18 | Payer: MEDICARE

## 2022-05-18 ENCOUNTER — Ambulatory Visit: Admit: 2022-05-18 | Discharge: 2022-05-18 | Payer: MEDICARE

## 2022-05-18 DIAGNOSIS — T148XXA Other injury of unspecified body region, initial encounter: Secondary | ICD-10-CM

## 2022-05-18 DIAGNOSIS — G061 Intraspinal abscess and granuloma: Secondary | ICD-10-CM

## 2022-05-18 DIAGNOSIS — I2510 Atherosclerotic heart disease of native coronary artery without angina pectoris: Secondary | ICD-10-CM

## 2022-05-18 DIAGNOSIS — E785 Hyperlipidemia, unspecified: Secondary | ICD-10-CM

## 2022-05-18 DIAGNOSIS — D3A8 Other benign neuroendocrine tumors: Secondary | ICD-10-CM

## 2022-05-18 DIAGNOSIS — R911 Solitary pulmonary nodule: Secondary | ICD-10-CM

## 2022-05-18 DIAGNOSIS — I10 Essential (primary) hypertension: Secondary | ICD-10-CM

## 2022-05-18 DIAGNOSIS — K219 Gastro-esophageal reflux disease without esophagitis: Secondary | ICD-10-CM

## 2022-05-18 DIAGNOSIS — M5134 Other intervertebral disc degeneration, thoracic region: Secondary | ICD-10-CM

## 2022-05-18 DIAGNOSIS — M2550 Pain in unspecified joint: Secondary | ICD-10-CM

## 2022-05-18 DIAGNOSIS — K2270 Barrett's esophagus without dysplasia: Secondary | ICD-10-CM

## 2022-05-18 DIAGNOSIS — M4800 Spinal stenosis, site unspecified: Secondary | ICD-10-CM

## 2022-05-18 DIAGNOSIS — M5030 Other cervical disc degeneration, unspecified cervical region: Secondary | ICD-10-CM

## 2022-05-18 NOTE — Patient Instructions
You are scheduled for surgery with Dr. Leeann Must on 12/8.    If you have any questions, please contact Charlott Rakes, or Byron Peacock at [phone] (M-F 8a-4:30p)  After hours, you may call [phone] (Evenings/Weekends/Holidays)

## 2022-05-19 ENCOUNTER — Inpatient Hospital Stay: Admit: 2022-05-19 | Discharge: 2022-05-19 | Payer: MEDICARE

## 2022-05-19 ENCOUNTER — Encounter: Admit: 2022-05-19 | Discharge: 2022-05-19 | Payer: MEDICARE

## 2022-05-19 DIAGNOSIS — D3A8 Other benign neuroendocrine tumors: Secondary | ICD-10-CM

## 2022-05-30 ENCOUNTER — Encounter: Admit: 2022-05-30 | Discharge: 2022-05-30 | Payer: MEDICARE

## 2022-05-30 DIAGNOSIS — Z01812 Encounter for preprocedural laboratory examination: Secondary | ICD-10-CM

## 2022-05-30 DIAGNOSIS — R079 Chest pain, unspecified: Secondary | ICD-10-CM

## 2022-05-30 MED ORDER — SODIUM CHLORIDE 0.9 % IV SOLP
INTRAVENOUS | 0 refills
Start: 2022-05-30 — End: ?

## 2022-05-30 MED ORDER — HEPARIN, PORCINE (PF) 5,000 UNIT/0.5 ML IJ SYRG
5000 [IU] | Freq: Once | SUBCUTANEOUS | 0 refills
Start: 2022-05-30 — End: ?

## 2022-05-30 MED ORDER — LIDOCAINE (PF) 10 MG/ML (1 %) IJ SOLN
.2 mL | INTRAMUSCULAR | 0 refills | PRN
Start: 2022-05-30 — End: ?

## 2022-06-07 ENCOUNTER — Encounter: Admit: 2022-06-07 | Discharge: 2022-06-07 | Payer: MEDICARE

## 2022-06-07 ENCOUNTER — Ambulatory Visit: Admit: 2022-06-07 | Discharge: 2022-06-07 | Payer: MEDICARE

## 2022-06-07 DIAGNOSIS — I10 Essential (primary) hypertension: Secondary | ICD-10-CM

## 2022-06-07 DIAGNOSIS — G061 Intraspinal abscess and granuloma: Secondary | ICD-10-CM

## 2022-06-07 DIAGNOSIS — K219 Gastro-esophageal reflux disease without esophagitis: Secondary | ICD-10-CM

## 2022-06-07 DIAGNOSIS — M5030 Other cervical disc degeneration, unspecified cervical region: Secondary | ICD-10-CM

## 2022-06-07 DIAGNOSIS — T148XXA Other injury of unspecified body region, initial encounter: Secondary | ICD-10-CM

## 2022-06-07 DIAGNOSIS — M2550 Pain in unspecified joint: Secondary | ICD-10-CM

## 2022-06-07 DIAGNOSIS — I2510 Atherosclerotic heart disease of native coronary artery without angina pectoris: Secondary | ICD-10-CM

## 2022-06-07 DIAGNOSIS — M4800 Spinal stenosis, site unspecified: Secondary | ICD-10-CM

## 2022-06-07 DIAGNOSIS — R399 Unspecified symptoms and signs involving the genitourinary system: Secondary | ICD-10-CM

## 2022-06-07 DIAGNOSIS — E785 Hyperlipidemia, unspecified: Secondary | ICD-10-CM

## 2022-06-07 DIAGNOSIS — K2270 Barrett's esophagus without dysplasia: Secondary | ICD-10-CM

## 2022-06-07 DIAGNOSIS — M5134 Other intervertebral disc degeneration, thoracic region: Secondary | ICD-10-CM

## 2022-06-07 DIAGNOSIS — N5089 Other specified disorders of the male genital organs: Secondary | ICD-10-CM

## 2022-06-07 MED ORDER — TAMSULOSIN 0.4 MG PO CAP
.8 mg | ORAL_CAPSULE | Freq: Every day | ORAL | 3 refills | 90.00000 days | Status: AC
Start: 2022-06-07 — End: ?

## 2022-06-07 NOTE — Assessment & Plan Note
61 y.o. 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  His symptoms have significantly worsened after initial improvement with Flomax.  Discussed with patient given his worsening symptoms with plan to double his tamsulosin but ultimately he is tending towards surgical management.  Given his complex neurologic history prior to any surgical invention we will proceed with urodynamics.  Regarding his left scrotal swelling he is not bothered by this and clinically appears to be a hydrocele therefore no intervention needed until patient is uncomfortable.    1) lower urinary tract symptoms  Improved with Flomax now worsened.  -0.4 mg Flomax twice daily  -Urodynamics for surgical planning    2) left scrotal swelling  Likely hydrocele, patient not bothered by this.  -Will continue observation

## 2022-06-08 ENCOUNTER — Encounter: Admit: 2022-06-08 | Discharge: 2022-06-08 | Payer: MEDICARE

## 2022-06-08 ENCOUNTER — Ambulatory Visit: Admit: 2022-06-08 | Discharge: 2022-06-08 | Payer: MEDICARE

## 2022-06-08 DIAGNOSIS — Z01812 Encounter for preprocedural laboratory examination: Secondary | ICD-10-CM

## 2022-06-08 DIAGNOSIS — I10 Essential (primary) hypertension: Secondary | ICD-10-CM

## 2022-06-08 DIAGNOSIS — R079 Chest pain, unspecified: Secondary | ICD-10-CM

## 2022-06-08 DIAGNOSIS — G061 Intraspinal abscess and granuloma: Secondary | ICD-10-CM

## 2022-06-08 DIAGNOSIS — M5134 Other intervertebral disc degeneration, thoracic region: Secondary | ICD-10-CM

## 2022-06-08 DIAGNOSIS — M5030 Other cervical disc degeneration, unspecified cervical region: Secondary | ICD-10-CM

## 2022-06-08 DIAGNOSIS — M4800 Spinal stenosis, site unspecified: Secondary | ICD-10-CM

## 2022-06-08 DIAGNOSIS — E785 Hyperlipidemia, unspecified: Secondary | ICD-10-CM

## 2022-06-08 DIAGNOSIS — K219 Gastro-esophageal reflux disease without esophagitis: Secondary | ICD-10-CM

## 2022-06-08 DIAGNOSIS — I2510 Atherosclerotic heart disease of native coronary artery without angina pectoris: Secondary | ICD-10-CM

## 2022-06-08 DIAGNOSIS — M2550 Pain in unspecified joint: Secondary | ICD-10-CM

## 2022-06-08 DIAGNOSIS — K2270 Barrett's esophagus without dysplasia: Secondary | ICD-10-CM

## 2022-06-08 DIAGNOSIS — T148XXA Other injury of unspecified body region, initial encounter: Secondary | ICD-10-CM

## 2022-06-08 LAB — COMPREHENSIVE METABOLIC PANEL
ALBUMIN: 4.1 g/dL (ref 3.5–5.0)
ALK PHOSPHATASE: 50 U/L (ref 25–110)
ALT: 23 U/L (ref 7–56)
ANION GAP: 9 (ref 3–12)
AST: 19 U/L (ref 7–40)
BLD UREA NITROGEN: 13 mg/dL (ref 7–25)
CALCIUM: 9.4 mg/dL (ref 8.5–10.6)
CO2: 27 MMOL/L (ref 21–30)
CREATININE: 1.3 mg/dL — ABNORMAL HIGH (ref 0.4–1.24)
EGFR: >60 mL/min (ref >60)
GLUCOSE,PANEL: 92 mg/dL (ref 70–100)
POTASSIUM: 3.9 MMOL/L (ref 3.5–5.1)
SODIUM: 140 MMOL/L (ref 137–147)
TOTAL BILIRUBIN: 0.6 mg/dL (ref 0.3–1.2)
TOTAL PROTEIN: 6.6 g/dL (ref 6.0–8.0)

## 2022-06-08 LAB — CBC
RBC COUNT: 5.3 M/UL (ref 4.4–5.5)
WBC COUNT: 4.9 K/UL (ref 4.5–11.0)

## 2022-06-08 LAB — COVID-19 (SARS-COV-2) PCR

## 2022-06-08 LAB — HEMOGLOBIN A1C: HEMOGLOBIN A1C: 5.5 % (ref 4.0–5.7)

## 2022-06-08 NOTE — Unmapped
[facility] Anesthesia Pre-Procedure Evaluation    Name: Darin [name]      MRN: [id number]     DOB: [birth year]     Age: 61 y.o.     Sex: male   _________________________________________________________________________     Procedure Info:   Procedure Information       Date/Time: 06/09/22 1027    Procedure: Robotic assisted video assisted thoracoscopy with RIGHT MIDDLE LOBE lobectomy, bronchoscopy (Right)    Location: CVOR 3 / CVOR    Providers: Arlyce Harman, MD            Physical Assessment  Vital Signs (last filed in past 24 hours):  BP: 144/85 (12/06 1525)  Temp: 36.5 ?C (97.7 ?F) (12/06 1525)  Pulse: 71 (12/06 1525)  Height: 172.7 cm (5' 8) (12/06 1525)  Weight: 101.5 kg (223 lb 12.8 oz) (12/06 1525)      Patient History   Allergies   Allergen Reactions    Clonazepam RASH    Morphine VOMITING        Current Medications    Medication Directions   albuterol sulfate (PROAIR HFA) 90 mcg/actuation HFA aerosol inhaler Inhale 2 puffs by mouth every 4-6 hours AS NEEDED SHORTNESS OF BREATH Darin [name]   calcium carbonate (TUMS PO) Take 1 tablet by mouth daily.   carisoprodoL (SOMA) 350 mg tablet Take one tablet by mouth three times daily.   esomeprazole DR(+) (NEXIUM) 20 mg capsule Take one capsule by mouth every morning. Take on an empty stomach at least 1 hour before or 2 hours after food.   fexofenadine (ALLEGRA) 180 mg tablet Take one tablet by mouth daily as needed.   gabapentin (NEURONTIN) 300 mg capsule Take three capsules by mouth three times daily.   HYDROcodone/acetaminophen (NORCO) 5/325 mg tablet two tablets twice daily.   tamsulosin (FLOMAX) 0.4 mg capsule Take two capsules by mouth daily. Take 30 min after same meal.  Do not cut/ crush/ chew.       Review of Systems/Medical History      Patient summary reviewed  Pertinent labs reviewed    PONV Screening: Non-smoker    No history of anesthetic complications    No family history of anesthetic complications      Airway         05/14/20; Mask ventilation not attempted (0); Video laryngoscopy, Stylet; Single-Lumen, Cuffed; ETT Size: 7.27mm; GlideScope; Blade Size: 4; Cricoid Pressure: No; Oral; 1-Full view of the glottis; 1 insertion attempt      Pulmonary             No recent URI      Shortness of breath      RML neuroendocrine tumor      Cardiovascular       Recent diagnostic studies:          ECG, echocardiogram and stress test      Exercise tolerance: >4 METS      Hypertension, well controlled          No past MI    Coronary artery disease        PTCA (stent x 1 (2003))        No angina      Hyperlipidemia      No orthopnea      Dyspnea on exertion      GI/Hepatic/Renal             GERD, well controlled        No liver disease:  No renal disease (Cr 1.30; no known kidney disease):           Neuro/Psych           No CVA      Neuropathy (BUE, BLE)      Hx of spinal cord abscess in 1987; 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    Neurological Findings:       Alert and oriented x 3    Constitutional findings:       No acute distress       Diagnostic Tests  Hematology:   Lab Results   Component Value Date    HGB 15.9 06/08/2022    HCT 45.8 06/08/2022    PLTCT 209 06/08/2022    WBC 4.9 06/08/2022    NEUT 36 04/18/2022    ANC 1.47 04/18/2022    ALC 2.10 04/18/2022    MONA 11 04/18/2022    AMC 0.43 04/18/2022    EOSA 1 04/18/2022    ABC 0.03 04/18/2022    MCV 85.6 06/08/2022 MCH 29.8 06/08/2022    MCHC 34.8 06/08/2022    MPV 7.2 06/08/2022    RDW 13.4 06/08/2022         General Chemistry:   Lab Results   Component Value Date    NA 140 06/08/2022    K 3.9 06/08/2022    CL 104 06/08/2022    CO2 27 06/08/2022    GAP 9 06/08/2022    BUN 13 06/08/2022    CR 1.30 06/08/2022    GLU 92 06/08/2022    CA 9.4 06/08/2022    ALBUMIN 4.1 06/08/2022    TOTBILI 0.6 06/08/2022      Coagulation:   Lab Results   Component Value Date    PTT 30.2 04/22/2020    INR 1.0 04/22/2020     EKG 05/16/22    05/16/22 - Procedure: GE MULTI GATED SESTAMIBI REGADENOSON MPI STRESS TEST   SUMMARY/OPINION:   1. This study is low risk for significant inducible myocardial ischemia.  There is a small / mild, partially fixed apical defect could be suggestive of prior myocardial injury minimal preinjury ischemia.  Summed stress score of 7, summed rest score of 4.  In the abnormal but low risk range.  Soft tissue attenuation is present and the defect has normal motion which could be related to soft tissue attenuation (no prone + technique  available).    2. Left ventricular systolic function is normal.   3. There are no high risk prognostic indicators present.    4. The pharmacologic ECG portion of the study is negative for ischemia.     There was a D-SPECT study in 2019 that was normal.      05/09/22 -  2D + DOPPLER ECHO     Left Ventricle: The left ventricular size is normal. Concentric remodeling. The left ventricular systolic function is normal. The visually estimated ejection fraction is 60%. There are no segmental wall motion abnormalities. Normal left ventricular diastolic function. Normal left atrial pressure.    Right Ventricle: The right ventricular size is normal. The right ventricular systolic function is normal.    Left Atrium: Normal size. Right Atrium: Normal size.    No hemodynamically significant valvular abnormalities.      PAC Plan    Interview: Clinic Interview    ASA Score: 3    Anesthesia Options Discussed: General                          PAC RISK ASSESSMENTS:   *[facility] Activity Status Index (DASI): 28.7  , Calculated METS: 6.27   (score < 25 correlates with increased risk for death, MI, and moderate to severe complications, max score 57)  *STOP-BANG Score: 3   (total 5-8 high risk for OSA, 3-4 with HCO3 >28 also high risk. OSA associated with greater than twice the odds for respiratory failure, cardiac events, ICU admission, and difficult intubation)      Alerts

## 2022-06-08 NOTE — Telephone Encounter
RN spoke with pt and reviewed arrival time for surgery tomorrow. RN notified pt he needs to be at hospital at 0830 and he will check in at the [facility] admissions.    Pt verbalized understanding.    Elior Robinette,RN

## 2022-06-08 NOTE — Progress Notes
Pre-op education appt complete with Catalina Antigua and Anne Ng, wife; who will be w/ pt on DOS.  Informed of current visitor policy.  Reviewed and provided written instructions.  Instructed pt to be NPO @ 2300 the noc before surgery; except for a sip of water to take any meds that the [facility] pharmacist or surgeon's office instructs pt to take on the morning of surgery. As per written instructions, pt to take 2 preop showers w/ provided CHG 4% soap prior to coming to the hospital for surgery.  Instructed to check in at [facility] at 0900 on DOS. Discussed process for pre-op, intra-op and Gregg Winchell-op recovery.  Discussed Kieryn Burtis-op pain,  the use of PCA, and importance of mobilization and s/s of infection after discharge.  Instructed pt to call the [place] office if having any health changes prior to surgery.  Pt verbalized understanding of all instructions.  Denies skin or dental issues; denies sensitivity to metals. Pt denies current smoking (never). Preop covid swab and labs drawn in [facility] Lab.  All questions answered to the pt's satisfaction. Escorted to [facility] Admitting for pre-registration and to [facility].   MP

## 2022-06-09 ENCOUNTER — Encounter: Admit: 2022-06-09 | Discharge: 2022-06-09 | Payer: MEDICARE

## 2022-06-09 ENCOUNTER — Inpatient Hospital Stay: Admit: 2022-06-09 | Discharge: 2022-06-09 | Payer: MEDICARE

## 2022-06-09 DIAGNOSIS — M5134 Other intervertebral disc degeneration, thoracic region: Secondary | ICD-10-CM

## 2022-06-09 DIAGNOSIS — I10 Essential (primary) hypertension: Secondary | ICD-10-CM

## 2022-06-09 DIAGNOSIS — G061 Intraspinal abscess and granuloma: Secondary | ICD-10-CM

## 2022-06-09 DIAGNOSIS — M4800 Spinal stenosis, site unspecified: Secondary | ICD-10-CM

## 2022-06-09 DIAGNOSIS — I2510 Atherosclerotic heart disease of native coronary artery without angina pectoris: Secondary | ICD-10-CM

## 2022-06-09 DIAGNOSIS — M2550 Pain in unspecified joint: Secondary | ICD-10-CM

## 2022-06-09 DIAGNOSIS — E785 Hyperlipidemia, unspecified: Secondary | ICD-10-CM

## 2022-06-09 DIAGNOSIS — M5030 Other cervical disc degeneration, unspecified cervical region: Secondary | ICD-10-CM

## 2022-06-09 DIAGNOSIS — K2270 Barrett's esophagus without dysplasia: Secondary | ICD-10-CM

## 2022-06-09 DIAGNOSIS — T148XXA Other injury of unspecified body region, initial encounter: Secondary | ICD-10-CM

## 2022-06-09 DIAGNOSIS — K219 Gastro-esophageal reflux disease without esophagitis: Secondary | ICD-10-CM

## 2022-06-09 MED ORDER — CEFAZOLIN 1 GRAM IJ SOLR
INTRAVENOUS | 0 refills | Status: DC
Start: 2022-06-09 — End: 2022-06-09

## 2022-06-09 MED ORDER — ELECTROLYTE-A IV SOLP
INTRAVENOUS | 0 refills | Status: DC
Start: 2022-06-09 — End: 2022-06-09

## 2022-06-09 MED ORDER — CALCIUM CHLORIDE 100 MG/ML (10 %) IV SYRG
INTRAVENOUS | 0 refills | Status: DC
Start: 2022-06-09 — End: 2022-06-09

## 2022-06-09 MED ORDER — ROCURONIUM 10 MG/ML IV SOLN
INTRAVENOUS | 0 refills | Status: DC
Start: 2022-06-09 — End: 2022-06-09

## 2022-06-09 MED ORDER — ONDANSETRON HCL (PF) 4 MG/2 ML IJ SOLN
INTRAVENOUS | 0 refills | Status: DC
Start: 2022-06-09 — End: 2022-06-09

## 2022-06-09 MED ORDER — LIDOCAINE (PF) 20 MG/ML (2 %) IJ SOLN
INTRAVENOUS | 0 refills | Status: DC
Start: 2022-06-09 — End: 2022-06-09

## 2022-06-09 MED ORDER — PROPOFOL INJ 10 MG/ML IV VIAL
INTRAVENOUS | 0 refills | Status: DC
Start: 2022-06-09 — End: 2022-06-09

## 2022-06-09 MED ORDER — KETAMINE 10 MG/ML IJ SOLN
INTRAVENOUS | 0 refills | Status: DC
Start: 2022-06-09 — End: 2022-06-09

## 2022-06-09 MED ORDER — HYDROMORPHONE (PF) 2 MG/ML IJ SYRG
INTRAVENOUS | 0 refills | Status: DC
Start: 2022-06-09 — End: 2022-06-09

## 2022-06-09 MED ORDER — EPINEPHRINE 0.1MG NS 10ML SYR (AM)(OR)
INTRAVENOUS | 0 refills | Status: DC
Start: 2022-06-09 — End: 2022-06-09
  Administered 2022-06-09 (×2): 10 ug via INTRAVENOUS

## 2022-06-09 MED ORDER — PHENYLEPHRINE 40 MCG/ML IN NS IV DRIP (STD CONC)
INTRAVENOUS | 0 refills | Status: DC
Start: 2022-06-09 — End: 2022-06-09
  Administered 2022-06-09 (×2): .4 ug/kg/min via INTRAVENOUS

## 2022-06-09 MED ORDER — EPHEDRINE SULFATE 50 MG/ML IV SOLN
INTRAVENOUS | 0 refills | Status: DC
Start: 2022-06-09 — End: 2022-06-09

## 2022-06-09 MED ORDER — LACTATED RINGERS IV SOLP
INTRAVENOUS | 0 refills | Status: DC
Start: 2022-06-09 — End: 2022-06-09

## 2022-06-09 MED ORDER — ACETAMINOPHEN 1,000 MG/100 ML (10 MG/ML) IV SOLN
INTRAVENOUS | 0 refills | Status: DC
Start: 2022-06-09 — End: 2022-06-09

## 2022-06-09 MED ORDER — FENTANYL CITRATE (PF) 50 MCG/ML IJ SOLN
INTRAVENOUS | 0 refills | Status: DC
Start: 2022-06-09 — End: 2022-06-09

## 2022-06-09 MED ORDER — DEXAMETHASONE SODIUM PHOSPHATE 4 MG/ML IJ SOLN
INTRAVENOUS | 0 refills | Status: DC
Start: 2022-06-09 — End: 2022-06-09

## 2022-06-09 MED ORDER — PHENYLEPHRINE HCL IN 0.9% NACL 1 MG/10 ML (100 MCG/ML) IV SYRG
INTRAVENOUS | 0 refills | Status: DC
Start: 2022-06-09 — End: 2022-06-09

## 2022-06-09 MED ADMIN — BUPIVACAINE LIPOSOME (PF) 1.3 % (13.3 MG/ML) LINF SUSP [309618]: 20 mL | @ 21:00:00 | Stop: 2022-06-09 | NDC 65250026620

## 2022-06-09 MED ADMIN — METHOCARBAMOL 100 MG/ML IJ SOLN [4970]: 1000 mg | INTRAVENOUS | @ 23:00:00 | Stop: 2022-06-09 | NDC 71288071611

## 2022-06-09 MED ADMIN — SODIUM CHLORIDE 0.9 % IV SOLP [27838]: INTRAVENOUS | @ 15:00:00 | Stop: 2022-06-09 | NDC 00338004904

## 2022-06-09 MED ADMIN — HYDROMORPHONE (PF) 2 MG/ML IJ SYRG [163476]: 0.5 mg | INTRAVENOUS | @ 23:00:00 | Stop: 2022-06-10 | NDC 00409131203

## 2022-06-09 MED ADMIN — HEPARIN, PORCINE (PF) 5,000 UNIT/0.5 ML IJ SYRG [95535]: 5000 [IU] | SUBCUTANEOUS | @ 17:00:00 | Stop: 2022-06-09 | NDC 00409131611

## 2022-06-09 MED ADMIN — HYDROMORPHONE PCA 10 MG/50 ML SYR (STD CONC)(ADULT)(PREMADE) [212382]: 50.000 mL | INTRAVENOUS | NDC 70004030022

## 2022-06-09 MED ADMIN — LACTATED RINGERS IV SOLP [4318]: INTRAVENOUS | Stop: 2022-06-09 | NDC 00338011704

## 2022-06-09 MED ADMIN — LABETALOL 5 MG/ML IV SYRG [86579]: 10 mg | INTRAVENOUS | @ 22:00:00 | Stop: 2022-06-09 | NDC 00409233924

## 2022-06-09 MED ADMIN — MAGNESIUM OXIDE 400 MG (241.3 MG MAGNESIUM) PO TAB [10491]: 400 mg | ORAL | @ 23:00:00 | Stop: 2022-06-25 | NDC 64980033912

## 2022-06-10 ENCOUNTER — Inpatient Hospital Stay: Admit: 2022-06-10 | Discharge: 2022-06-10 | Payer: MEDICARE

## 2022-06-10 MED ADMIN — HYDROCODONE-ACETAMINOPHEN 5-325 MG PO TAB [34505]: 2 | ORAL | @ 20:00:00 | NDC 00406012323

## 2022-06-10 MED ADMIN — HYDROCODONE-ACETAMINOPHEN 5-325 MG PO TAB [34505]: 2 | ORAL | NDC 00904682461

## 2022-06-10 MED ADMIN — CEFAZOLIN INJ 1GM IVP [210319]: 1 g | INTRAVENOUS | @ 05:00:00 | Stop: 2022-06-10 | NDC 60505614200

## 2022-06-10 MED ADMIN — METHOCARBAMOL 500 MG PO TAB [4971]: 750 mg | ORAL | @ 12:00:00 | NDC 00904705761

## 2022-06-10 MED ADMIN — GABAPENTIN 300 MG PO CAP [18308]: 900 mg | ORAL | @ 20:00:00 | NDC 67877022305

## 2022-06-10 MED ADMIN — PANTOPRAZOLE 40 MG PO TBEC [80436]: 40 mg | ORAL | @ 02:00:00 | NDC 00904647461

## 2022-06-10 MED ADMIN — ONDANSETRON HCL (PF) 4 MG/2 ML IJ SOLN [136012]: 4 mg | INTRAVENOUS | @ 06:00:00 | NDC 72266012301

## 2022-06-10 MED ADMIN — MAGNESIUM OXIDE 400 MG (241.3 MG MAGNESIUM) PO TAB [10491]: 400 mg | ORAL | @ 12:00:00 | Stop: 2022-06-25 | NDC 64980033912

## 2022-06-10 MED ADMIN — ONDANSETRON HCL (PF) 4 MG/2 ML IJ SOLN [136012]: 4 mg | INTRAVENOUS | @ 12:00:00 | NDC 72266012301

## 2022-06-10 MED ADMIN — POTASSIUM CHLORIDE 20 MEQ PO TBTQ [35943]: 20 meq | ORAL | @ 12:00:00 | Stop: 2023-06-09 | NDC 00832532511

## 2022-06-10 MED ADMIN — POLYETHYLENE GLYCOL 3350 17 GRAM PO PWPK [25424]: 17 g | ORAL | @ 15:00:00 | NDC 00904693186

## 2022-06-10 MED ADMIN — ACETAMINOPHEN 500 MG PO TAB [102]: 1000 mg | ORAL | @ 15:00:00 | Stop: 2022-06-10 | NDC 00904673061

## 2022-06-10 MED ADMIN — SENNOSIDES-DOCUSATE SODIUM 8.6-50 MG PO TAB [40926]: 2 | ORAL | @ 02:00:00 | NDC 00536124810

## 2022-06-10 MED ADMIN — GABAPENTIN 300 MG PO CAP [18308]: 300 mg | ORAL | @ 12:00:00 | Stop: 2022-06-10 | NDC 67877022305

## 2022-06-10 MED ADMIN — HEPARIN, PORCINE (PF) 5,000 UNIT/0.5 ML IJ SYRG [95535]: 5000 [IU] | SUBCUTANEOUS | @ 15:00:00 | NDC 00409131611

## 2022-06-10 MED ADMIN — TAMSULOSIN 0.4 MG PO CAP [80077]: 0.4 mg | ORAL | @ 01:00:00 | NDC 68084029911

## 2022-06-10 MED ADMIN — HEPARIN, PORCINE (PF) 5,000 UNIT/0.5 ML IJ SYRG [95535]: 5000 [IU] | SUBCUTANEOUS | @ 20:00:00 | NDC 00409131611

## 2022-06-10 MED ADMIN — OXYCODONE 10 MG PO TAB [166908]: 10 mg | ORAL | @ 16:00:00 | Stop: 2022-06-10 | NDC 68084096811

## 2022-06-10 MED ADMIN — SENNOSIDES-DOCUSATE SODIUM 8.6-50 MG PO TAB [40926]: 2 | ORAL | @ 15:00:00 | NDC 00536124810

## 2022-06-10 MED ADMIN — HEPARIN, PORCINE (PF) 5,000 UNIT/0.5 ML IJ SYRG [95535]: 5000 [IU] | SUBCUTANEOUS | @ 02:00:00 | NDC 00409131611

## 2022-06-10 MED ADMIN — CEFAZOLIN INJ 1GM IVP [210319]: 1 g | INTRAVENOUS | @ 12:00:00 | Stop: 2022-06-10 | NDC 60505614200

## 2022-06-10 MED ADMIN — METHOCARBAMOL 500 MG PO TAB [4971]: 750 mg | ORAL | @ 05:00:00 | NDC 00904705761

## 2022-06-10 MED ADMIN — METHOCARBAMOL 500 MG PO TAB [4971]: 750 mg | ORAL | @ 20:00:00 | NDC 00904705761

## 2022-06-10 MED ADMIN — GABAPENTIN 300 MG PO CAP [18308]: 300 mg | ORAL | @ 05:00:00 | NDC 67877022305

## 2022-06-11 ENCOUNTER — Encounter: Admit: 2022-06-11 | Discharge: 2022-06-11 | Payer: MEDICARE

## 2022-06-11 ENCOUNTER — Inpatient Hospital Stay: Admit: 2022-06-11 | Discharge: 2022-06-11 | Payer: MEDICARE

## 2022-06-11 DIAGNOSIS — M5030 Other cervical disc degeneration, unspecified cervical region: Secondary | ICD-10-CM

## 2022-06-11 DIAGNOSIS — M5134 Other intervertebral disc degeneration, thoracic region: Secondary | ICD-10-CM

## 2022-06-11 DIAGNOSIS — M4800 Spinal stenosis, site unspecified: Secondary | ICD-10-CM

## 2022-06-11 DIAGNOSIS — K219 Gastro-esophageal reflux disease without esophagitis: Secondary | ICD-10-CM

## 2022-06-11 DIAGNOSIS — G061 Intraspinal abscess and granuloma: Secondary | ICD-10-CM

## 2022-06-11 DIAGNOSIS — K2270 Barrett's esophagus without dysplasia: Secondary | ICD-10-CM

## 2022-06-11 DIAGNOSIS — I10 Essential (primary) hypertension: Secondary | ICD-10-CM

## 2022-06-11 DIAGNOSIS — I2510 Atherosclerotic heart disease of native coronary artery without angina pectoris: Secondary | ICD-10-CM

## 2022-06-11 DIAGNOSIS — M2550 Pain in unspecified joint: Secondary | ICD-10-CM

## 2022-06-11 DIAGNOSIS — E785 Hyperlipidemia, unspecified: Secondary | ICD-10-CM

## 2022-06-11 DIAGNOSIS — T148XXA Other injury of unspecified body region, initial encounter: Secondary | ICD-10-CM

## 2022-06-11 MED ADMIN — MAGNESIUM OXIDE 400 MG (241.3 MG MAGNESIUM) PO TAB [10491]: 400 mg | ORAL | @ 14:00:00 | Stop: 2022-06-11 | NDC 64980033912

## 2022-06-11 MED ADMIN — SENNOSIDES-DOCUSATE SODIUM 8.6-50 MG PO TAB [40926]: 2 | ORAL | @ 04:00:00 | NDC 00536124810

## 2022-06-11 MED ADMIN — PANTOPRAZOLE 40 MG PO TBEC [80436]: 40 mg | ORAL | @ 04:00:00 | NDC 00904647461

## 2022-06-11 MED ADMIN — POLYETHYLENE GLYCOL 3350 17 GRAM PO PWPK [25424]: 17 g | ORAL | @ 04:00:00 | NDC 00904693186

## 2022-06-11 MED ADMIN — TAMSULOSIN 0.4 MG PO CAP [80077]: 0.4 mg | ORAL | @ 04:00:00 | NDC 68084029911

## 2022-06-11 MED ADMIN — HYDROCODONE-ACETAMINOPHEN 5-325 MG PO TAB [34505]: 2 | ORAL | @ 12:00:00 | Stop: 2022-06-11 | NDC 00904682461

## 2022-06-11 MED ADMIN — POTASSIUM CHLORIDE 20 MEQ PO TBTQ [35943]: 20 meq | ORAL | @ 12:00:00 | Stop: 2022-06-11 | NDC 00832532511

## 2022-06-11 MED ADMIN — GABAPENTIN 300 MG PO CAP [18308]: 900 mg | ORAL | @ 04:00:00 | NDC 67877022305

## 2022-06-11 MED ADMIN — HYDROCODONE-ACETAMINOPHEN 5-325 MG PO TAB [34505]: 2 | ORAL | @ 08:00:00 | Stop: 2022-06-11 | NDC 00904682461

## 2022-06-11 MED ADMIN — HYDROCODONE-ACETAMINOPHEN 5-325 MG PO TAB [34505]: 2 | ORAL | @ 04:00:00 | NDC 00904682461

## 2022-06-11 MED ADMIN — METHOCARBAMOL 500 MG PO TAB [4971]: 750 mg | ORAL | @ 04:00:00 | NDC 00904705761

## 2022-06-11 MED ADMIN — METHOCARBAMOL 500 MG PO TAB [4971]: 750 mg | ORAL | @ 12:00:00 | Stop: 2022-06-11 | NDC 00904705761

## 2022-06-11 MED ADMIN — HEPARIN, PORCINE (PF) 5,000 UNIT/0.5 ML IJ SYRG [95535]: 5000 [IU] | SUBCUTANEOUS | @ 04:00:00 | NDC 00409131611

## 2022-06-11 MED ADMIN — GABAPENTIN 300 MG PO CAP [18308]: 900 mg | ORAL | @ 12:00:00 | Stop: 2022-06-11 | NDC 67877022305

## 2022-06-11 MED FILL — METHOCARBAMOL 750 MG PO TAB: 750 mg | ORAL | 10 days supply | Qty: 30 | Fill #1 | Status: CP

## 2022-06-14 ENCOUNTER — Encounter: Admit: 2022-06-14 | Discharge: 2022-06-14 | Payer: MEDICARE

## 2022-06-14 DIAGNOSIS — D3A8 Other benign neuroendocrine tumors: Secondary | ICD-10-CM

## 2022-06-22 ENCOUNTER — Ambulatory Visit: Admit: 2022-06-22 | Discharge: 2022-06-22 | Payer: MEDICARE

## 2022-06-22 ENCOUNTER — Encounter: Admit: 2022-06-22 | Discharge: 2022-06-22 | Payer: MEDICARE

## 2022-06-22 DIAGNOSIS — D3A8 Other benign neuroendocrine tumors: Secondary | ICD-10-CM

## 2022-06-22 DIAGNOSIS — K219 Gastro-esophageal reflux disease without esophagitis: Secondary | ICD-10-CM

## 2022-06-22 DIAGNOSIS — I2510 Atherosclerotic heart disease of native coronary artery without angina pectoris: Secondary | ICD-10-CM

## 2022-06-22 DIAGNOSIS — M2550 Pain in unspecified joint: Secondary | ICD-10-CM

## 2022-06-22 DIAGNOSIS — M5030 Other cervical disc degeneration, unspecified cervical region: Secondary | ICD-10-CM

## 2022-06-22 DIAGNOSIS — M5134 Other intervertebral disc degeneration, thoracic region: Secondary | ICD-10-CM

## 2022-06-22 DIAGNOSIS — M4800 Spinal stenosis, site unspecified: Secondary | ICD-10-CM

## 2022-06-22 DIAGNOSIS — T148XXA Other injury of unspecified body region, initial encounter: Secondary | ICD-10-CM

## 2022-06-22 DIAGNOSIS — E785 Hyperlipidemia, unspecified: Secondary | ICD-10-CM

## 2022-06-22 DIAGNOSIS — Z08 Encounter for follow-up examination after completed treatment for malignant neoplasm: Secondary | ICD-10-CM

## 2022-06-22 DIAGNOSIS — I10 Essential (primary) hypertension: Secondary | ICD-10-CM

## 2022-06-22 DIAGNOSIS — K2270 Barrett's esophagus without dysplasia: Secondary | ICD-10-CM

## 2022-06-22 DIAGNOSIS — G061 Intraspinal abscess and granuloma: Secondary | ICD-10-CM

## 2022-06-22 DIAGNOSIS — G8918 Other acute postprocedural pain: Secondary | ICD-10-CM

## 2022-06-22 MED ORDER — METHOCARBAMOL 750 MG PO TAB
750 mg | ORAL_TABLET | Freq: Three times a day (TID) | ORAL | 0 refills | Status: AC
Start: 2022-06-22 — End: ?

## 2022-06-22 NOTE — Patient Instructions
If you have any questions, please contact Heather, Patric Buckhalter or Whitney at [phone] (M-F 8a-4:30p)  After hours, you may call [phone] (Evenings/Weekends/Holidays)

## 2022-06-29 ENCOUNTER — Encounter: Admit: 2022-06-29 | Discharge: 2022-06-29 | Payer: MEDICARE

## 2022-07-06 ENCOUNTER — Encounter: Admit: 2022-07-06 | Discharge: 2022-07-06 | Payer: MEDICARE

## 2022-08-22 ENCOUNTER — Encounter: Admit: 2022-08-22 | Discharge: 2022-08-22 | Payer: MEDICARE

## 2022-08-28 ENCOUNTER — Encounter: Admit: 2022-08-28 | Discharge: 2022-08-28 | Payer: MEDICARE

## 2022-08-28 NOTE — Telephone Encounter
RN placed call to patient to check in. Patient reports having some swelling still as mentioned last week in MyChart message, but does say it has gone down. Patient encouraged to call if it persists or gets worse, verbalized understanding. Patient denies any other concerns or recent hospitalizations. Patient due for follow up 12/2022.   K. Titus Dubin, RN

## 2022-10-17 ENCOUNTER — Encounter: Admit: 2022-10-17 | Discharge: 2022-10-17 | Payer: MEDICARE

## 2022-10-18 ENCOUNTER — Encounter: Admit: 2022-10-18 | Discharge: 2022-10-18 | Payer: MEDICARE

## 2022-10-18 NOTE — Telephone Encounter
10/18/2022 Records request faxed per workqueue task below. [phone]  sdc    Cardiologist, Dr. Milta Deiters, [facility], [phone]

## 2022-10-19 ENCOUNTER — Encounter: Admit: 2022-10-19 | Discharge: 2022-10-19 | Payer: MEDICARE

## 2022-10-19 ENCOUNTER — Ambulatory Visit: Admit: 2022-10-19 | Discharge: 2022-10-19 | Payer: MEDICARE

## 2022-10-19 DIAGNOSIS — M4800 Spinal stenosis, site unspecified: Secondary | ICD-10-CM

## 2022-10-19 DIAGNOSIS — E785 Hyperlipidemia, unspecified: Secondary | ICD-10-CM

## 2022-10-19 DIAGNOSIS — Z136 Encounter for screening for cardiovascular disorders: Secondary | ICD-10-CM

## 2022-10-19 DIAGNOSIS — M5134 Other intervertebral disc degeneration, thoracic region: Secondary | ICD-10-CM

## 2022-10-19 DIAGNOSIS — I25118 Atherosclerotic heart disease of native coronary artery with other forms of angina pectoris: Secondary | ICD-10-CM

## 2022-10-19 DIAGNOSIS — G061 Intraspinal abscess and granuloma: Secondary | ICD-10-CM

## 2022-10-19 DIAGNOSIS — R0602 Shortness of breath: Secondary | ICD-10-CM

## 2022-10-19 DIAGNOSIS — K2270 Barrett's esophagus without dysplasia: Secondary | ICD-10-CM

## 2022-10-19 DIAGNOSIS — I2510 Atherosclerotic heart disease of native coronary artery without angina pectoris: Secondary | ICD-10-CM

## 2022-10-19 DIAGNOSIS — K219 Gastro-esophageal reflux disease without esophagitis: Secondary | ICD-10-CM

## 2022-10-19 DIAGNOSIS — M5030 Other cervical disc degeneration, unspecified cervical region: Secondary | ICD-10-CM

## 2022-10-19 DIAGNOSIS — I10 Essential (primary) hypertension: Secondary | ICD-10-CM

## 2022-10-19 DIAGNOSIS — M2550 Pain in unspecified joint: Secondary | ICD-10-CM

## 2022-10-19 DIAGNOSIS — E7800 Pure hypercholesterolemia, unspecified: Secondary | ICD-10-CM

## 2022-10-19 DIAGNOSIS — T148XXA Other injury of unspecified body region, initial encounter: Secondary | ICD-10-CM

## 2022-10-19 MED ORDER — DOCUSATE SODIUM 100 MG PO CAP
100 mg | Freq: Every day | ORAL | 0 refills | PRN
Start: 2022-10-19 — End: ?

## 2022-10-19 MED ORDER — ASPIRIN 81 MG PO TBEC
81 mg | Freq: Once | ORAL | 0 refills | Status: DC
Start: 2022-10-19 — End: 2022-10-19

## 2022-10-19 MED ORDER — ASPIRIN 81 MG PO TBEC
81 mg | Freq: Every day | ORAL | 0 refills | Status: AC
Start: 2022-10-19 — End: ?

## 2022-10-19 MED ORDER — ALUMINUM-MAGNESIUM HYDROXIDE 200-200 MG/5 ML PO SUSP
30 mL | ORAL | 0 refills | PRN
Start: 2022-10-19 — End: ?

## 2022-10-19 MED ORDER — ACETAMINOPHEN 325 MG PO TAB
650 mg | ORAL | 0 refills | PRN
Start: 2022-10-19 — End: ?

## 2022-10-19 MED ORDER — ASPIRIN 325 MG PO TAB
325 mg | Freq: Once | ORAL | 0 refills
Start: 2022-10-19 — End: ?

## 2022-10-19 MED ORDER — AMLODIPINE 5 MG PO TAB
5 mg | ORAL_TABLET | Freq: Every day | ORAL | 1 refills | Status: AC
Start: 2022-10-19 — End: ?

## 2022-10-19 NOTE — Progress Notes
Date of Service: 10/19/2022    Darin [name] is a 62 y.o. male.       HPI   The patient is a pleasant 62 year old gentleman from [place] which is about an hour from here.  The by his pulmonologist due to increasing substernal chest tightness and shortness of breath.  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  He had an echocardiogram that looked okay in November 2023.  He had a stress nuclear abnormality in 2023 which showed an ejection fraction of 77% and a relatively small partially reversible anteroapical defect with a summed stress score of 7.  This was a regadenoson MPI at that time.  He was watched conservatively but more recently his symptoms have worsened substantially.  He says he has been having increasing exertional dyspnea which seems to occur with limited activity including going up a flight of stairs or walking or even sometimes being on the phone.  He is getting recurrent episodes of substernal chest tightness with some similarities to his previous heart trouble.  Sometimes the chest discomfort can worsen with stress.  He can also get occasional sharp jabbing pain with activity but the jabbing pain only lasts 10 or 15 seconds.  The chest tightness tends to last more like 5 to 10 minutes.  He had an episode of near syncope on Easter shortly after standing and getting very dizzy.  He thinks he did not lose consciousness.  He has had no bleeding troubles no fevers or chills.  He does have multiple risk factors for ischemic heart disease.  An EKG obtained today demonstrates sinus rhythm without significant abnormalities.  His blood pressure today was 128/82.  His exam is otherwise unremarkable.  The patient does not smoke or drink alcohol.  He is retired from being a Nurse, adult.  His father had bypass surgery at age 50.  His mother died with Alzheimer's.  A brother had his first coronary stent at age 52 and subsequent bypass surgery.  His second brother died after heart valve surgery.       The patient, with a history of stenting in 2003, presents with worsening dyspnea and chest discomfort since July. The dyspnea is severe enough to limit conversation and physical activity, including climbing stairs. The chest discomfort is described as a tightness in the mid-sternal region, possibly stress-related, lasting approximately 6-10 minutes. Additionally, the patient experiences a sharp, transient pain radiating from the chest to the back during physical activity.    The patient also reports episodes of palpitations post-physical activity and an episode of syncope upon rising to use the bathroom. The patient denies any recent changes in weight, vision, or hearing, and denies any bleeding. However, they do report persistent fatigue and occasional dizziness.    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 The patient denies any history of diabetes, but reports occasional high blood pressure and slightly elevated cholesterol.    The patient's family history is significant for early onset cardiovascular disease, with a father and brother both requiring bypass surgery in their early 56s.  The patient's stent was placed just before their 41th birthday. The patient has not been under the care of a cardiologist for some time, but due to the worsening symptoms and significant family history, they are seeking further cardiac evaluation.         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  No other significant epicardial or coronary lesions identified.  08/22/2012 Heart Cath: Mild instent restenosis in LAD with negative FFR measurement of 0.92.  No other significant lesions seen in LAD or diag branches.  Mild atherosclerosis RCA no significant lesions.  Normal LV contractility and EF.  07/23/2014 Cardiolite stress test: Normal myocardial perfusion study.  No evidence for significant ischemia or scar noted.  Normal LV function.      Hyperlipemia 09/14/2017    Essential hypertension 09/14/2017       Social History  Social History     Tobacco Use    Smoking status: Never    Smokeless tobacco: Never    Tobacco comments:     nO HX   Vaping Use    Vaping status: Never Used   Substance and Sexual Activity    Alcohol use: Not Currently     Comment: maybe once a month    Drug use: No     Comment: No HX    Sexual activity: Yes     Partners: Female     Birth control/protection: None        Family History  Family History   Problem Relation Age of Onset    Alzheimer's Mother     Coronary Artery Disease Father     Cancer Father     Heart Disease Brother     Diabetes Brother     Heart problem Brother         Triple bypass    None Reported Son     None Reported Maternal Grandmother     None Reported Maternal Grandfather     Cancer-Colon Paternal Grandmother     Heart Disease Paternal Grandfather     Heart Disease Brother     Heart Attack Brother     Heart problem Brother     Cancer Brother         Review of Systems   Constitutional: Positive for malaise/fatigue. Negative for decreased appetite, weight gain and weight loss.   HENT: Negative.  Negative for hearing loss and nosebleeds.  Eyes: Negative.  Negative for vision loss in left eye and vision loss in right eye.   Cardiovascular:  Positive for chest pain, dyspnea on exertion and palpitations. Negative for leg swelling and syncope.   Respiratory:  Positive for shortness of breath. Negative for cough.    Endocrine: Negative.  Negative for cold intolerance and heat intolerance.   Hematologic/Lymphatic: Negative.  Negative for adenopathy and bleeding problem. Does not bruise/bleed easily.   Skin: Negative.  Negative for color change, rash and suspicious lesions.   Musculoskeletal: Negative.  Negative for joint swelling and muscle weakness.   Gastrointestinal: Negative.  Negative for hematochezia and melena.   Genitourinary: Negative.  Negative for hematuria.   Neurological:  Positive for dizziness, light-headedness and loss of balance. Negative for vertigo.   Psychiatric/Behavioral: Negative.  Negative for altered mental status and memory loss.    Allergic/Immunologic: Negative.  Negative for environmental allergies.       Physical Exam   Nursing note and vitals reviewed.  Constitutional: He appears well-developed.  Non-toxic appearance. No distress.   HENT:   Head: Normocephalic and atraumatic.   Nose: Nose normal.   Mouth/Throat: Mucous membranes are moist.   Eyes: Right eye exhibits no discharge. Left eye exhibits no discharge. No scleral icterus.   Neck: No JVD present. Carotid bruit is not present. No thyromegaly present.   Cardiovascular: Normal rate and regular rhythm. Exam reveals no gallop and no S3.   No murmur heard.  Pulmonary/Chest: Breath sounds normal. No stridor. No tachypnea. No respiratory distress. He has no wheezes. He has no rales. He exhibits no tenderness.   Abdominal: Soft. Normal appearance. He exhibits no distension and no mass. There is no splenomegaly or hepatomegaly. There is no abdominal tenderness. There is no guarding.   Musculoskeletal:         General: No swelling or tenderness.      Cervical back: Neck supple. No rigidity or tenderness.      Right lower leg: No edema.      Left lower leg: No edema.   Lymphadenopathy:     He has no cervical adenopathy.   Neurological: He is alert and oriented to person, place, and time. He is not disoriented. No cranial nerve deficit.   Skin: Skin is warm and dry. No bruising and no rash noted. He is not diaphoretic. No erythema. No jaundice.   Psychiatric: His behavior is normal. Mood, judgment and thought content normal. His mood appears not anxious. His affect is not inappropriate.       Cardiovascular Studies  Most recent results for 12-Lead ECG   ECG 12-LEAD    Collection Time: 10/19/22  4:27 PM   Result Value Status    VENTRICULAR RATE 61 Final    P-R INTERVAL 174 Final    QRS DURATION 68 Final    Q-T INTERVAL 382 Final    QTC CALCULATION (BAZETT) 384 Final    P AXIS 42 Final    R AXIS 48 Final    T AXIS 61 Final    Impression    Normal sinus rhythm  Normal ECG  No previous ECGs available in [facility]  Confirmed by Francine Graven ([id number]) on 10/19/2022 4:34:59 PM     05/09/22   2D + DOPPLER ECHO   Result Value Ref Range    BSA 2.17 m2    Interp Only Sonographer [facility] [place] [place]     LA size 3.5 3.0 - 4.0 cm    LA volume  51 18 - 58 mL    Left Atrium Index 23.50 16 - 34 mL/m2    Right Ventricular Basal Diameter 3.5 2.5 - 4.1 cm    Right Atrial Area 12.3 <18 cm2    Right Ventricular Mid Diameter 2.8 1.9 - 3.5 cm    Right Heart Systolic Mmode TAPSE 1.8 >1.7 cm    Sinus 3.8 2.8 - 4.0 cm    Ascending aorta 3.2 cm    LVOT diameter 2.0 cm    LVOT area 3.14 cm2 LVOT peak vel 0.8 m/s    LVOT peak VTI 16.5 cm    LVOT stroke volume 51.84 cm3    AV peak velocity 1.1 m/s    Ao VTI 23.0 cm    Aortic valve area = 2.25 cm2    , with a mean gradient of 2 mmHg    and a peak gradient of 5 mmHg    AV index (native) 0.73     E/A ratio 1.43     TDI Medial e' 0.060 m/s    Medial E/E' ratio 11.00     TDI lateral e' 0.080 m/s    Lateral E/E' ratio 8.25     E wave decelartion time 211.0 msec    MV Peak E Vel PW 0.660 m/s    MV Peak A Vel 0.460 m/s    Right Heart Systolic TDI S' 0.1 m/s    AV Stroke Volume Index 24     ECHO EF 60 %    LVIDD 4.2 4.2 - 5.8 cm    LVIDS 2.3 2.5 - 4.0 cm    IVS 1.2 0.6 - 1.0 cm    PW 1.3 0.6 - 1.0 cm    FS 45.24 28 - 44 %    Teichholtz 76.90 %    LV mass 189 88 - 224 g    Left Ventricle Mass Index 87 49 - 115 g/m2    RWT 0.62 <=0.42    Left Ventricle Systolic Volume 24 21 - 61 mL    Left Ventricle Systolic Volume Index 11 11 - 31 mL/m2    Left Ventricle Diastolic Volume 68 62 - 150 mL    Left Ventricle Diastolic Volume Index 31 34 - 74 mL/m2         Cardiovascular Health Factors  Vitals BP Readings from Last 3 Encounters:   10/19/22 128/82   06/22/22 128/68   06/11/22 135/77     Wt Readings from Last 3 Encounters:   10/19/22 91.6 kg (202 lb)   06/22/22 99.8 kg (220 lb)   06/11/22 100.8 kg (222 lb 3.2 oz)     BMI Readings from Last 3 Encounters:   10/19/22 30.71 kg/m?   06/22/22 33.45 kg/m?   06/11/22 33.79 kg/m?      Smoking Social History     Tobacco Use   Smoking Status Never   Smokeless Tobacco Never   Tobacco Comments    nO HX      Lipid Profile No results found for: CHOL  No results found for: HDL  No results found for: LDL  No results found for: TRIG   Blood Sugar Hemoglobin A1C   Date Value Ref Range Status   06/08/2022 5.5 4.0 - 5.7 % Final     Comment:     The ADA recommends that most patients with type 1 and type 2 diabetes maintain   an A1c level <7%.       Glucose   Date Value  Ref Range Status   06/11/2022 108 (H) 70 - 100 MG/DL Final 16/04/9603 540 (H) 70 - 100 MG/DL Final   98/05/9146 829 (H) 70 - 100 MG/DL Final     Glucose, POC   Date Value Ref Range Status   06/09/2022 159 (H) 70 - 100 MG/DL Final   56/21/3086 94 70 - 100 MG/DL Final      Lab Results  Lab Results   Component Value Date    HGB 13.7 06/11/2022    HCT 40.3 06/11/2022    PLTCT 174 06/11/2022      Lab Results   Component Value Date    K 4.1 06/11/2022    CR 1.24 06/11/2022      Results for orders placed or performed during the hospital encounter of 06/22/22   CHEST 2 VIEWS    Narrative    CHEST 2 VIEWS     INDICATION: Neuroendocrine tumor, s/p resection    COMPARISON: 06/11/2022      Impression    Stable postsurgical changes of right middle lobectomy with mild volume loss. No consolidation, pleural effusion or pneumothorax.    Heart is normal in size.       Finalized by Katina Dung, M.D. on 06/22/2022 4:41 PM. Dictated by Katina Dung, M.D. on 06/22/2022 4:38 PM.          Problems Addressed Today  Encounter Diagnoses   Name Primary?    Screening for heart disease Yes    Coronary artery disease involving native heart, unspecified vessel or lesion type, unspecified whether angina present     Pure hypercholesterolemia     Essential hypertension     Shortness of breath        Current Medications (including today's revisions)   calcium carbonate (TUMS PO) Take 1 tablet by mouth daily as needed.    carisoprodoL (SOMA) 350 mg tablet Take one tablet by mouth three times daily.    esomeprazole DR(+) (NEXIUM) 20 mg capsule Take one capsule by mouth every morning. Take on an empty stomach at least 1 hour before or 2 hours after food.    fexofenadine (ALLEGRA) 180 mg tablet Take one tablet by mouth daily as needed.    gabapentin (NEURONTIN) 300 mg capsule Take three capsules by mouth three times daily.    HYDROcodone/acetaminophen (NORCO) 5/325 mg tablet Take one tablet to two tablets by mouth every 4 hours as needed. Do not start before June 29, 2022. HYDROcodone/acetaminophen (NORCO) 5/325 mg tablet Take two tablets by mouth twice daily.  May take an additional 1 tablet at bedtime if needed.    methocarbamoL (ROBAXIN) 750 mg tablet Take one tablet by mouth three times daily. Indications: muscle spasm    tamsulosin (FLOMAX) 0.4 mg capsule Take two capsules by mouth daily. Take 30 min after same meal.  Do not cut/ crush/ chew. (Patient taking differently: Take two capsules by mouth at bedtime daily. Take 30 min after same meal.  Do not cut/ crush/ chew.)       Assessment and Plan           Coronary Artery Disease: History of stenting in 2003, now presenting with worsening shortness of breath and chest tightness, particularly with physical activity. Family history of early onset CAD. Recent stress test reportedly normal, but patient and family have history of significant CAD not detected on stress testing.  -Start Aspirin 81mg  daily for secondary prevention.  -Start Amlodipine 5mg  daily to help with symptoms and potentially improve coronary blood  flow.  -Order comprehensive metabolic panel to assess kidney function prior to potential angiogram.  -Schedule coronary angiogram to directly assess coronary arteries given concerning symptoms and family history.    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  -No new plan discussed in this visit.    Lung Cancer: History of neuroendocrine tumor in right middle lobe, resected in December 2023.  -No new plan discussed in this visit.    Syncope: Single episode of near syncope in the context of getting up to use the bathroom early in the morning.  -No new plan discussed in this visit.    Follow-up:  -Check comprehensive metabolic panel.  -Schedule coronary angiogram.  -Follow-up after angiogram to discuss results and further management.       Impression  1.  Ischemic heart disease with remote coronary stenting in 2023 at the age of 61.  He now has worsening exertional dyspnea and chest discomfort.  He had a mildly abnormal stress MPI in November 2023.  2.  Structurally normal heart by echo in November 2023  3.  Hypertension  4.  Hypercholesterolemia  5.  History of cervical myelopathy with previous cervical surgeries for degenerative changes and previous abscess  6.  History of neuroendocrine tumor of the lung with right middle lobe resection in December 2023    Plan  I have reviewing everything we have recommended the patient undergo left heart catheterization with coronary angiography and possible PCI.  We discussed the procedure and risks in detail.  We have added amlodipine 5 mg daily.  I am hesitant to add a beta-blocker given his significant pulmonary issues.  We will add aspirin 81 mg daily.  We discussed the procedure and risks in detail.  We will also check the patient's labs including an A1c and a fasting lipid profile.  Further plans depend upon his findings and course.  He will call if he worsens in the interim.    Francine Graven MD

## 2022-10-19 NOTE — Patient Instructions
CARDIAC CATHETERIZATION   PRE-ADMISSION INSTRUCTIONS    Patient Name: Darin [name]  MRN#: [id number]  Date of Birth: September 10, 1960 (62 y.o.)  Today's Date: 10/19/2022    PROCEDURE:  You are scheduled for a Coronary Angiogram with possible Angioplasty/Stenting and Left Heart Pressures Evaluation with Dr. Francine Graven.    PROCEDURE DATE AND ARRIVAL TIME:  Your procedure date is 11/03/22.  You will receive a call from the Cath lab staff between 8:00 a.m. and noon on the business day prior to your procedure to let you know at what time to arrive on the day of your procedure.    Please check in at the Admitting Desk in the [facility] for your procedure.   Address:  [address] [place], [place] [address]    [facility] Entrance and and take a right. Continue down the hallway past the Cardiovascular Medicine office. That hall will take you into the [facility]. Check in at the desk on the left side.)     (If you have further questions regarding your arrival time for the CV lab, please call [phone] by 3:00pm the day before your procedure. Please leave a message with your name and number, your call will be returned in a timely manner.)    PRE-PROCEDURE APPOINTMENTS:    AFTER 10/20/22 and PRIOR to 11/03/22     Pre-Admission lab work required within 14 days of procedure: CBC, CMP, and Fasting Lipids at the lab of your choice.         FOOD AND DRINK INSTRUCTIONS  Nothing to eat after midnight before your procedure. No caffeine for 24 hours prior to your procedure. You will be under moderate sedation for your procedure.  You may drink clear liquids up to an hour before hospital arrival. This will be confirmed by the Cath lab staff the day before your procedure.     SPECIAL MEDICATION INSTRUCTIONS  Any new prescriptions will be sent to your pharmacy listed on file with [place].     HOLD:   HOLD ALL erectile dysfunction medications for 3 days, unless prescribed for pulmonary hypertension.  HOLD ALL over the counter vitamins or supplements on the morning of your procedure.    TAKE:  TAKE EITHER 4 BABY ASPIRIN or 1 FULL STRENGTH NON-COATED ASPIRIN THE MORNING OF YOUR PROCEDURE     Additional Instructions  If you wear CPAP, please bring your mask and machine with you to the hospital.    Take a bath or shower with anti-bacterial soap the evening before, or the morning of the procedure.     Bring photo ID and your health insurance card(s).    Arrange for a driver to take you home from the hospital. Please arrange for a friend or family member to take you home from this test. You cannot take a Taxi, [employer] Goad, or public transportation as there has to be a responsible person to help care for you after sedation    Bring an accurate list of your current medications with you to the hospital (all medications and supplements taken daily).  Please use the medication list below and write in the date and time when you took your last dose before your procedure. Update this list of medications as needed.      Wear comfortable clothes and don't bring valuables, other than photo identification card, with you to the hospital.    Please pack a bag for an overnight stay.     Please review your pre-procedure instructions  and bring them with you on the day of your procedure.  Call the office at  [phone]  with any questions. You may ask to speak with Dr. Lanora Manis nurse.        ALLERGIES  Allergies   Allergen Reactions    Clonazepam RASH    Morphine VOMITING       CURRENT MEDICATIONS  Outpatient Encounter Medications as of 10/19/2022   Medication Sig Dispense Refill    amLODIPine (NORVASC) 5 mg tablet Take one tablet by mouth daily. 90 tablet 1    calcium carbonate (TUMS PO) Take 1 tablet by mouth daily as needed.      carisoprodoL (SOMA) 350 mg tablet Take one tablet by mouth three times daily.      esomeprazole DR(+) (NEXIUM) 20 mg capsule Take one capsule by mouth every morning. Take on an empty stomach at least 1 hour before or 2 hours after food.      fexofenadine (ALLEGRA) 180 mg tablet Take one tablet by mouth daily as needed.      gabapentin (NEURONTIN) 300 mg capsule Take three capsules by mouth three times daily. 810 capsule 1    HYDROcodone/acetaminophen (NORCO) 5/325 mg tablet Take one tablet to two tablets by mouth every 4 hours as needed. Do not start before June 29, 2022. 50 tablet 0    HYDROcodone/acetaminophen (NORCO) 5/325 mg tablet Take two tablets by mouth twice daily.  May take an additional 1 tablet at bedtime if needed.      methocarbamoL (ROBAXIN) 750 mg tablet Take one tablet by mouth three times daily. Indications: muscle spasm 30 tablet 0    tamsulosin (FLOMAX) 0.4 mg capsule Take two capsules by mouth daily. Take 30 min after same meal.  Do not cut/ crush/ chew. (Patient taking differently: Take two capsules by mouth at bedtime daily. Take 30 min after same meal.  Do not cut/ crush/ chew.) 180 capsule 3     Facility-Administered Encounter Medications as of 10/19/2022   Medication Dose Route Frequency Provider Last Rate Last Admin    aspirin EC (ASPIR-LOW) tablet 81 mg  81 mg Oral ONCE Spaedy, Donella Stade, MD           _________________________________________  Form completed by: Leafy Half, RN  Date completed: 10/19/22  Method: In person and given to the patient.      Coronary Angiography  Angiography is a special type of moving X-ray that lets your doctor view your coronary arteries to see if the blood vessels to your heart are narrowed or blocked. This test is done when someone is having a heart attack. Or it may be done if symptoms may mean a heart attack. It also may be done after an abnormal cardiac stress test.    Before the procedure   Tell your healthcare team what medicines you take and any allergies you may have.  Tell your healthcare team if you've had a reaction to contrast dye or have had any kidney problems.  Follow any directions you are given for not eating or drinking before surgery.  A nurse will place an IV (intravenous) catheter in your vein to give fluids, and medicine to relieve pain and help you feel less anxious.  They clean your skin and shave the area where the catheter will be inserted, if needed.    During the procedure   You will lie on a table with a portable X-ray machine over you. The team will place a surgical drape over  your body. The area where the doctor chooses to insert the catheter will be cleaned. This will be either a wrist or the groin.  Your doctor will place a long, thin tube (catheter) inside an artery in your groin or arm and guide it into your heart. You may feel pressure with the insertion of the catheter. A numbing medicine often is injected at the insertion site. This eases discomfort during the procedure.  They will inject a contrast dye through the catheter into your blood vessels or heart chambers. You may feel a warm sensation or feeling like you have to urinate when the contrast is injected. This is normal.  X-rays are taken to show images of the inside of your heart and coronary arteries.     The catheter can be placed into the groin, arm, or wrist.     After the procedure   Your healthcare team will tell you how long to lie down and keep the insertion site still. The amount of time may depend on whether a closure device such as a stitch or collagen plug was used to close the opening made in your artery. The time you must be still may be shorter if one of these devices was used. The amount of time will also depend on if there is any bleeding at the catheter insertion site.  If the insertion site was in your groin, you may need to lie down with your leg still for several hours. If the insertion site was in your wrist, a pressure bandage may be put on the site. Or you may have closure device placed on the insertion site. It will be taken off when there is no sign of bleeding. If bleeding occurs, a nurse will put pressure on the area to control it.  A nurse will check your blood pressure and the insertion site often. This is to make sure you remain stable after the procedure.  You may be asked to drink fluid to help flush the contrast liquid out of your system.  Have someone drive you home from the hospital.  If your doctor uses angioplasty or a stent to treat a blocked artery, you may stay the night in the hospital. If there are multiple blockages that can't be fixed with a stent or angioplasty, you may need surgery to bypass the blockages. This is called coronary artery bypass graft surgery. Your doctor will explain the results of your test and what treatment options that may be best for you.  It?s normal to find a small bruise or lump at the insertion site. The lump may be the collagen plug or stitch that you feel, or a small bruise. These common side effects should disappear within a few weeks.  You will be given instructions by your healthcare team on recovering from the coronary angiography. In general, don't lift anything heavier than a gallon of milk for several days. This gives time for the puncture site in the artery wall to heal. Try not to get the puncture site wet. Don't put it under water. Showers are OK. Don't soak in a bathtub, swimming pool, or hot tub until the skin has healed.    When to call your healthcare provider   Call your healthcare provider right away if you have any of these:   Symptoms of infection. These include pain, swelling, redness, bleeding, or drainage at the insertion site.  Fever of 100.4?F (38?C) or higher, or as advised by your provider  Bleeding, bruising, or  a lot of swelling where the catheter was inserted  Blood in your urine  Black or tarry stools  Any unusual bleeding  Irregular, very slow, or fast heartbeat  Dizziness  Call 911  Call 911if any of these occur:     Chest pain  Shortness of breath  Sudden numbness or weakness in arms, legs, or face, or difficulty speaking  The puncture site swells up very fast  Bleeding from the puncture site that does not slow down with firm pressure  Severe or increasing pain, numbness, coldness, or a bluish color in the leg or arm that held the catheter    StayWell last reviewed this educational content on 08/03/2020  ? 2000-2024 [employer], [place]. All rights reserved. This information is not intended as a substitute for professional medical care. Always follow your healthcare professional's instructions.        Coronary Stents  A stent is a small metal coil or mesh tube that is placed in a narrowed artery to hold it open. This helps improve blood flow to your heart. The stent also helps keep the artery from re-narrowing (restenosis). Most stents are coated and slowly release medicine over a time. This reduces the amount of scar tissue that forms in the artery, helping prevent restenosis. A heart specialist called an interventional cardiologist does coronary angioplasty and stenting.     During the procedure  A member of your healthcare team will numb the skin at the insertion site with a local anesthetic. This is usually the groin or the wrist) with a local anesthetic. They will make a needle puncture to insert the catheter.  Your doctor will insert a guide wire through the thin, flexible tube (catheter) and move it to the narrow spot in your heart's artery. Your doctor tracks its movement using pulsed X-ray called fluoroscopy. An angiogram will be done which is an X-ray movie of heart artery blood flow using contrast. This identifies the location of the stenosis.  Your doctor will then insert a balloon-tipped catheter through the guide catheter and thread it over the guide wire. They will position it at the narrow part of the artery.  Your doctor will deliver a stent mounted on a balloon-tipped catheter to the blockage in your artery.  They will inflate the balloon to expand the stent.  The expanded stent further compresses the plaque against the artery wall, increasing and restoring the blood flow to the heart muscle.    After the procedure  Your doctor will give you medicine to prevent blood clots from forming on the new stent. You will continue to take this medicine until the stent and artery have healed. Your healthcare team will tell you how long you should take this. Your doctor will give you a prescription before you go home. This prescription is often for a medicine called clopidogrel or others like it. This medicine is usually taken with aspirin to prevent blood clots from forming inside of the newly opened area of the artery which would cause a heart attack.  Your healthcare team will tell you how long to lie down and keep the insertion site still. The amount of time may depend on whether a closure device such as a stitch or collagen plug was used to close the opening made in your artery. The time you must be still may be shorter if one of these devices was used. The amount of time will also depend on if there is any bleeding at the artery  site.  If the insertion site was in your groin, you may need to lie down with your leg still for several hours. A pressure band is often used to hold pressure on the wrist. It's slowly removed once there is no sign of bleeding.  A nurse will check your blood pressure and the insertion site.  You may be asked to drink fluid to help flush the contrast liquid out of your system.  You will likely be given other prescriptions to prevent other areas of the arteries from narrowing. This includes medicine to control cholesterol, such as statins. You may also get a beta blocker to prevent a heart attack. Nitroglycerin is prescribed to treat episodes of chest pain (angina) if this occurs. Don't mix nitroglycerin with medicines that are used to treat erectile dysfunction or pulmonary hypertension. This can cause a dangerous drop in your blood pressure.  You need to have a follow up appointments to check how well you respond to the stent and new medicines. This can be as early as a week or maybe within 2 to 4 weeks after your coronary stent placement.  You may be able to go home the same day. Or you may spend the night in the hospital after your procedure. Your stay may be longer depending on your condition and the results of your procedure. You will get instructions for what to do when you go home to help you recover.  Have someone drive you home from the hospital.  It?s normal to find a small bruise or lump at the insertion site. This should disappear within a few weeks. ,Let your healthcare provider know if the bruise is large or very uncomfortable.  You will be given discharge instructions that tell you how to keep the puncture site clean and dry and what activity restrictions you may have after your procedure. Make sure you ask your healthcare team about any questions or concerns you have after your procedure.    When to call your healthcare provider  Call your healthcare provider right away if you have any of the following:  Increasing pain, swelling, redness, bleeding, extensive bruising, or drainage at the insertion site  Fever of 100.4?F (38?C) or higher, or as directed by your healthcare provider  Symptoms of infection such as redness, swelling, drainage, or warmth at the insertion site  Trouble urinating  Blood in your urine  Black or tarry stools  Any unusual bleeding  Irregular, very slow, or fast heartbeat  Dizziness  Call 911  Call 911 if any of the following occur:     Pain or discomfort in the chest, back, neck, throat, jaw, arms, or shoulders  Trouble breathing  Sudden numbness or weakness in arms, legs, or face, or difficulty speaking  The puncture site swells up very fast  Bleeding from the puncture site that does not slow down with firm pressure  Severe pain, coldness, numbness, or a bluish color in the leg or arm that held the catheter     StayWell last reviewed this educational content on 08/03/2020  ? 2000-2024 [employer], [place]. All rights reserved. This information is not intended as a substitute for professional medical care. Always follow your healthcare professional's instructions.

## 2022-10-20 ENCOUNTER — Encounter: Admit: 2022-10-20 | Discharge: 2022-10-20 | Payer: MEDICARE

## 2022-10-20 NOTE — Progress Notes
Web site with [facility], confirmed benefits and eligibility:  Current and active since 07/03/22, $295 copay to max OOP $3700, then plan will pay 100% of allowable charges.    Per Thurston Hole with [phone] prior Berkley Harvey is required for LV Cors [address], Prior auth not required poss PCI [phone].  Prior auth initiated with Thurston Hole for LV Cors 30865, case has been approved.  10/20/22-02/17/23  AUTH  # [id number]

## 2022-10-26 ENCOUNTER — Encounter: Admit: 2022-10-26 | Discharge: 2022-10-26 | Payer: MEDICARE

## 2022-10-26 DIAGNOSIS — I2510 Atherosclerotic heart disease of native coronary artery without angina pectoris: Secondary | ICD-10-CM

## 2022-10-26 DIAGNOSIS — I10 Essential (primary) hypertension: Secondary | ICD-10-CM

## 2022-10-26 DIAGNOSIS — E7800 Pure hypercholesterolemia, unspecified: Secondary | ICD-10-CM

## 2022-10-26 NOTE — Progress Notes
Please let the patient know that his recent laboratory studies looked excellent.  His thyroid function was normal.  His potassium, kidney function and liver enzymes were all normal.  His blood sugar was very slightly high at 116 but this is better compared to some of his previous measurements.    His blood counts were normal.  There was no sign of anemia.    I do not think he had a lipid profile.  Please see if we can add on a fasting lipid profile when he comes in for his heart catheterization.    Thank you!    AJS

## 2022-10-30 ENCOUNTER — Encounter: Admit: 2022-10-30 | Discharge: 2022-10-30 | Payer: MEDICARE

## 2022-10-30 NOTE — Telephone Encounter
Call to patient and reviewed answer to these questions that we could not make determination on this prior to cath because it would depend on cath findings. Reviewed that Dr. Anda Kraft will fix if he can if it is better to refer to CTS will refer but will talk to patient first and make determination based on pt physician decision making conversation. CTS if necessary would do same and make recommendations and decide plan based on patient/phsycian discussion and informed decision making. Pt reports he needs to plan for worst because he has a dog and his wife is not familiar with the area. Asked max of how long would be at hospital if the worst case scenario happened and would have to go for immediate bypass. Advised would likely be for a 3-5 days if he had no complications.    Pt verbalize understanding and will plan for pet to stay at boarding facility for a few days.

## 2022-10-31 ENCOUNTER — Encounter: Admit: 2022-10-31 | Discharge: 2022-10-31 | Payer: MEDICARE

## 2022-10-31 DIAGNOSIS — E7849 Other hyperlipidemia: Secondary | ICD-10-CM

## 2022-11-03 ENCOUNTER — Encounter: Admit: 2022-11-03 | Discharge: 2022-11-03 | Payer: MEDICARE

## 2022-11-03 MED ADMIN — SODIUM CHLORIDE 0.9 % IV SOLP [27838]: 500 mL | INTRAVENOUS | @ 15:00:00 | Stop: 2022-11-03 | NDC 00338004904

## 2022-11-06 ENCOUNTER — Encounter: Admit: 2022-11-06 | Discharge: 2022-11-06 | Payer: MEDICARE

## 2022-11-07 ENCOUNTER — Encounter: Admit: 2022-11-07 | Discharge: 2022-11-07 | Payer: MEDICARE

## 2022-11-07 NOTE — Progress Notes
Pharmacy Benefits Investigation    Medication name: REPATHA SURECLICK 140 MG/ML SC PNIJ    The insurance requires a prior authorization for the medication. The prior authorization was submitted via CoverMyMeds.    PA number: [id number]    Lucie Leather  Specialty Pharmacy Patient Advocate

## 2022-11-09 ENCOUNTER — Encounter: Admit: 2022-11-09 | Discharge: 2022-11-09 | Payer: MEDICARE

## 2022-11-09 NOTE — Progress Notes
Pharmacy Benefits Investigation    Medication name: REPATHA SURECLICK 140 MG/ML SC PNIJ    The prior authorization was approved for Deny Chevez Rinck (PA number [id number]) from 11/07/2022 through 05/10/2023.    Lucie Leather  Specialty Pharmacy Patient Advocate

## 2022-11-13 ENCOUNTER — Encounter: Admit: 2022-11-13 | Discharge: 2022-11-13 | Payer: MEDICARE

## 2022-11-13 ENCOUNTER — Ambulatory Visit: Admit: 2022-11-13 | Discharge: 2022-11-13 | Payer: MEDICARE

## 2022-11-13 DIAGNOSIS — M5134 Other intervertebral disc degeneration, thoracic region: Secondary | ICD-10-CM

## 2022-11-13 DIAGNOSIS — M5030 Other cervical disc degeneration, unspecified cervical region: Secondary | ICD-10-CM

## 2022-11-13 DIAGNOSIS — K219 Gastro-esophageal reflux disease without esophagitis: Secondary | ICD-10-CM

## 2022-11-13 DIAGNOSIS — K2270 Barrett's esophagus without dysplasia: Secondary | ICD-10-CM

## 2022-11-13 DIAGNOSIS — R42 Dizziness and giddiness: Secondary | ICD-10-CM

## 2022-11-13 DIAGNOSIS — M2550 Pain in unspecified joint: Secondary | ICD-10-CM

## 2022-11-13 DIAGNOSIS — I2510 Atherosclerotic heart disease of native coronary artery without angina pectoris: Secondary | ICD-10-CM

## 2022-11-13 DIAGNOSIS — G061 Intraspinal abscess and granuloma: Secondary | ICD-10-CM

## 2022-11-13 DIAGNOSIS — T148XXA Other injury of unspecified body region, initial encounter: Secondary | ICD-10-CM

## 2022-11-13 DIAGNOSIS — M4800 Spinal stenosis, site unspecified: Secondary | ICD-10-CM

## 2022-11-13 DIAGNOSIS — I10 Essential (primary) hypertension: Secondary | ICD-10-CM

## 2022-11-13 DIAGNOSIS — E785 Hyperlipidemia, unspecified: Secondary | ICD-10-CM

## 2022-11-13 NOTE — Progress Notes
[facility]  Cardiovascular Medicine     Date of Service: 11/13/2022    Darin [name] is a 62 y.o. male    History of Present Illness:     See Medical History/Course Below    [name] is a very pleasant 62 y.o. male with a past medical history of coronary artery disease, hypertension, hyperlipidemia, strong family history of premature coronary disease, lung resection.  He recently established with Dr. Anda Kraft and reported chest discomfort and fatigue.  On 11/03/2022 he underwent coronary angiography which revealed 80% long segmental stenosis of the proximal LAD with positive FFR.  He underwent successful PCI with 1 drug-eluting stent.  He was started on Plavix and aspirin.      He was not on a statin therapy. He was hesitant to start statins as he tells me it gave his brother diabetes.  Repatha was recommended.  He tells me he has not started the Repatha.  His concern is his dad and brother had pancreatic problems and he is concerned this medication will affect this.  He has not missed any doses of aspirin or Plavix.    Impression/ Plan :     Coronary artery disease with recent PCI.  He continues on DAPT with aspirin and Plavix and has not missed any doses.  His access  sites are well-healed.  He is not interested in cardiac rehab.  Dyslipidemia.  Most recent LDL dyslipidemia was 132.  He was not on statin therapy as above.  Repatha was prescribed however he is hesitant to start.  We discussed that a did not see pancreatic problems as a side effect from this medication on my medication app.  We also discussed his goal LDL of less than 55.  He would like to think about starting this medication further.  Hypertension.  Blood pressure well-controlled.  He requested a carotid ultrasound be done as his father had carotid stenosis.  Given his coronary artery disease, I think this is reasonable.    Please call us with any questions or concerns at [phone] Medication List:      amLODIPine (NORVASC) 5 mg tablet Take one tablet by mouth daily.    aspirin EC (ASPIR-LOW) 81 mg tablet Take one tablet by mouth daily.    calcium carbonate (TUMS PO) Take 1 tablet by mouth daily as needed.    carisoprodoL (SOMA) 350 mg tablet Take one tablet by mouth three times daily.    clopiDOGreL (PLAVIX) 75 mg tablet Take one tablet by mouth daily.    esomeprazole DR(+) (NEXIUM) 20 mg capsule Take one capsule by mouth every morning. Take on an empty stomach at least 1 hour before or 2 hours after food.    evolocumab (REPATHA SURECLICK) 140 mg/mL injectable PEN Inject 1 mL under the skin every 14 days.    fexofenadine (ALLEGRA) 180 mg tablet Take one tablet by mouth daily as needed.    gabapentin (NEURONTIN) 300 mg capsule Take three capsules by mouth three times daily.    HYDROcodone/acetaminophen (NORCO) 5/325 mg tablet Take one tablet to two tablets by mouth every 4 hours as needed. Do not start before June 29, 2022.    HYDROcodone/acetaminophen (NORCO) 5/325 mg tablet Take two tablets by mouth twice daily.  May take an additional 1 tablet at bedtime if needed.    methocarbamoL (ROBAXIN) 750 mg tablet Take one tablet by mouth three times daily. Indications: muscle spasm    tamsulosin (FLOMAX) 0.4 mg  capsule Take two capsules by mouth daily. Take 30 min after same meal.  Do not cut/ crush/ chew. (Patient taking differently: Take two capsules by mouth at bedtime daily. Take 30 min after same meal.  Do not cut/ crush/ chew.)            Review of Systems      Review of Systems   All other systems reviewed and are negative.      Physical Exam      BP 136/77 (BP Source: Arm, Left Upper, Patient Position: Sitting)  - Pulse 72  - Ht 172.7 cm (5' 8)  - Wt 90.3 kg (199 lb)  - SpO2 98%  - BMI 30.26 kg/m?     Physical Exam   Constitutional: He appears well-developed.   HENT:   Head: Normocephalic.   Cardiovascular: Normal rate.   Pulmonary/Chest: Effort normal and breath sounds normal. Abdominal: Soft. Bowel sounds are normal.   Musculoskeletal:      Cervical back: Neck supple.   Neurological: He is alert and oriented to person, place, and time.   Skin: Skin is warm and dry.       Social History:     Social History     Socioeconomic History    Marital status: Married   Tobacco Use    Smoking status: Never    Smokeless tobacco: Never    Tobacco comments:     nO HX   Vaping Use    Vaping status: Never Used   Substance and Sexual Activity    Alcohol use: Not Currently     Comment: maybe once a month    Drug use: No     Comment: No HX    Sexual activity: Yes     Partners: Female     Birth control/protection: None       Family History:     Family History   Problem Relation Age of Onset    Alzheimer's Mother     Coronary Artery Disease Father     Cancer Father     Heart Disease Brother     Diabetes Brother     Heart problem Brother         Triple bypass    None Reported Son     None Reported Maternal Grandmother     None Reported Maternal Grandfather     Cancer-Colon Paternal Grandmother     Heart Disease Paternal Grandfather     Heart Disease Brother     Heart Attack Brother     Heart problem Brother     Cancer Brother        Pertinent Studies:         Vitals:    11/13/22 1415   BP: 136/77   BP Source: Arm, Left Upper   Pulse: 72   SpO2: 98%   O2 Device: None (Room air)   PainSc: Five   Weight: 90.3 kg (199 lb)   Height: 172.7 cm (5' 8)     Body mass index is 30.26 kg/m?Marland Kitchen     Past Medical History      Patient Active Problem List    Diagnosis Date Noted    Difficult airway 06/09/2022    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  No other significant epicardial or coronary lesions identified.  08/22/2012 Heart Cath: Mild instent restenosis in LAD with negative FFR measurement of 0.92.  No other significant lesions seen in LAD or diag branches.  Mild atherosclerosis RCA no significant lesions.  Normal LV contractility and EF.  07/23/2014 Cardiolite stress test: Normal myocardial perfusion study.  No evidence for significant ischemia or scar noted.  Normal LV function.      Hyperlipemia 09/14/2017    Essential hypertension 09/14/2017         Problems Addressed Today  Encounter Diagnoses   Name Primary?    Coronary artery disease due to lipid rich plaque Yes    Dizziness     Dyslipidemia               I spent over  >30 minutes today for the visit including some or all of the following: preparing to see the patient, history/exam, placing orders, documenting the visit, communicating with care team, interpreting tests, and care coordination/communication.    This note was partially dictated using Dragon Medical One speech recognition software.  Occasional wrong-word or sound-alike substitutions may have occurred due to the inherent limitations of voice-recognition software.  Please read the chart carefully and recognize, using context, where the substitutions may have occurred.  Please do not hesitate to contact me for clarification.

## 2022-11-13 NOTE — Patient Instructions
Follow-Up:    -Thank you for allowing Darin [name] to participate in your care today. Your After Visit Summary is being completed by Melanee Spry, RN.      -Please schedule the following testing at check out, or by calling our scheduling line: carotid duplex artery testing     Changes From Today's Office Visit   NONE    Contacting our office:    -Business Hours: Monday-Friday, 8:00 am-4:30 pm (excluding Holidays).     -For medical questions or concerns, please send Darin [name] a message through your MyChart account or call the [facility] team nursing triage line at [phone]. Please leave a detailed message with your name, date of birth, and reason for your call.  If your message is received before 3:30pm, every effort will be made to call you back the same day.  Please allow time for Darin [name] to review your chart prior to call back.     -For medication refills please start by contacting your pharmacy. You can also send Darin [name] a prescription question through your MyChart or call the nurse triage line above.     -Should you have an immediate need of the weekend/nights and holidays, please call our on-call triage line at [phone].    -[facility] nursing team fax number: [phone]    -You may receive a survey in the upcoming weeks from [facility] [place] [facility]. Your feedback is important to Darin [name] and helps Darin [name] continue to improve patient care and patient satisfaction.     -Please feel free to call our Financial Department at [phone] with any questions or concerns about estimated cost of testing or imaging ordered today. We are happy to provide CPT codes upon request.    Results & Testing Follow Up:    -Please allow 5-7 business days for the results of any testing to be reviewed. Please call our office if you have not heard from a nurse within this time frame.    -Should you choose to complete testing at an outside facility, please contact our office after completion of testing so that we can ensure that we have received results for your provider to review.    Lab and test results:  As a part of the CARES act, starting 10/02/2019, some results will be released to you via MyChart immediately and automatically.  You may see results before your provider sees them; however, your provider will review all these results and then they, or one of their team, will notify you of result information and recommendations.   Critical results will be addressed immediately, but otherwise, please allow Darin [name] time to get back with you prior to you reaching out to Darin [name] for questions.  This will usually take about 72 hours for labs and 5-7 days for procedure test results.

## 2022-11-22 ENCOUNTER — Encounter: Admit: 2022-11-22 | Discharge: 2022-11-22 | Payer: MEDICARE

## 2022-11-29 ENCOUNTER — Encounter: Admit: 2022-11-29 | Discharge: 2022-11-29 | Payer: MEDICARE

## 2022-11-29 NOTE — Progress Notes
Pharmacy Benefits Investigation    Medication name: REPATHA SURECLICK 140 MG/ML SC PNIJ    The out of pocket cost is $47.    A grant is available through [employer]. A grant was obtained and will provide the patient with $2500 through 10/29/2023.    After assistance, the final out of pocket cost is $0.    Lucie Leather  Specialty Pharmacy Patient Advocate

## 2022-12-01 ENCOUNTER — Encounter: Admit: 2022-12-01 | Discharge: 2022-12-01 | Payer: MEDICARE

## 2022-12-02 ENCOUNTER — Encounter: Admit: 2022-12-02 | Discharge: 2022-12-02 | Payer: MEDICARE

## 2022-12-04 ENCOUNTER — Encounter: Admit: 2022-12-04 | Discharge: 2022-12-04 | Payer: MEDICARE

## 2022-12-04 MED FILL — REPATHA SURECLICK 140 MG/ML SC PNIJ: 140 mg/mL | SUBCUTANEOUS | 28 days supply | Qty: 2 | Fill #1 | Status: AC

## 2022-12-15 ENCOUNTER — Encounter: Admit: 2022-12-15 | Discharge: 2022-12-15 | Payer: MEDICARE

## 2022-12-27 ENCOUNTER — Encounter: Admit: 2022-12-27 | Discharge: 2022-12-27 | Payer: MEDICARE

## 2022-12-28 MED FILL — REPATHA SURECLICK 140 MG/ML SC PNIJ: 140 mg/mL | SUBCUTANEOUS | 28 days supply | Qty: 2 | Fill #2 | Status: AC

## 2023-01-05 ENCOUNTER — Encounter: Admit: 2023-01-05 | Discharge: 2023-01-05 | Payer: MEDICARE

## 2023-01-05 ENCOUNTER — Ambulatory Visit: Admit: 2023-01-05 | Discharge: 2023-01-05 | Payer: MEDICARE

## 2023-01-05 DIAGNOSIS — R42 Dizziness and giddiness: Secondary | ICD-10-CM

## 2023-01-05 DIAGNOSIS — I2510 Atherosclerotic heart disease of native coronary artery without angina pectoris: Secondary | ICD-10-CM

## 2023-01-08 NOTE — Progress Notes
Date of Service: 01/09/2023       Subjective:             Darin [name] is a 62 y.o. male.      History of Present Illness  Darin [name] returns to thoracic surgery clinic for follow up. He is a 62 y.o. 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     62 yo male admitted postoperatively after undergoing a robot right middle lobectomy and lymphadenectomy on 06/09/22. Typical carcinoid tumor, 2.1 cm, STAS present, pT1cN0.    Patient is doing well overall, no issues, still having post op neuralgia but it is improving.     Interim:  Left Heart Catheterization on 11/03/2022 which revealed 80% long segmental stenosis of the proximal LAD significant by FFR. 1 drug-eluting stent was successfully placed in that vessel.      CT C A P  CHEST:   1. Interval right middle lobectomy.     2. No metastatic disease in the thorax.       ABDOMEN AND PELVIS:   1. No metastatic disease in the abdomen or pelvis.          Review of Systems   Constitutional: Negative.   HENT: Negative.     Eyes: Negative.    Cardiovascular: Negative.    Respiratory: Negative.     Endocrine: Negative.    Hematologic/Lymphatic: Negative.    Skin: Negative.    Musculoskeletal: Negative.    Gastrointestinal: Negative.    Genitourinary: Negative.    Neurological: Negative.    Psychiatric/Behavioral: Negative.     Allergic/Immunologic: Negative.    All other systems reviewed and are negative.      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    aspirin EC (ASPIR-LOW) 81 mg tablet Take one tablet by mouth daily.    calcium carbonate (TUMS PO) Take 1 tablet by mouth daily as needed.    carisoprodoL (SOMA) 350 mg tablet Take one tablet by mouth three times daily.    clopiDOGreL (PLAVIX) 75 mg tablet Take one tablet by mouth daily.    esomeprazole DR(+) (NEXIUM) 20 mg capsule Take one capsule by mouth every morning. Take on an empty stomach at least 1 hour before or 2 hours after food.    evolocumab (REPATHA SURECLICK) 140 mg/mL injectable PEN Inject 1 mL under the skin every 14 days.    fexofenadine (ALLEGRA) 180 mg tablet Take one tablet by mouth daily as needed.    gabapentin (NEURONTIN) 300 mg capsule Take three capsules by mouth three times daily.    HYDROcodone/acetaminophen (NORCO) 5/325 mg tablet Take one tablet to two tablets by mouth every  4 hours as needed. Do not start before June 29, 2022.    HYDROcodone/acetaminophen (NORCO) 5/325 mg tablet Take two tablets by mouth twice daily.  May take an additional 1 tablet at bedtime if needed.    methocarbamoL (ROBAXIN) 750 mg tablet Take one tablet by mouth three times daily. Indications: muscle spasm    tamsulosin (FLOMAX) 0.4 mg capsule Take two capsules by mouth daily. Take 30 min after same meal.  Do not cut/ crush/ chew. (Patient taking differently: Take two capsules by mouth at bedtime daily. Take 30 min after same meal.  Do not cut/ crush/ chew.)     Vitals:    01/09/23 1118   BP: (!) 149/73   BP Source: Arm, Left Upper   Pulse: 55   Temp: 36.6 ?C (97.8 ?F)   Resp: 16   SpO2: 100%   TempSrc: Temporal   PainSc: Five   Weight: 99.1 kg (218 lb 6.4 oz)   Height: 172.7 cm (5' 8)     Body mass index is 33.21 kg/m?Marland Kitchen     Physical Exam  Vitals reviewed.   Constitutional:       Appearance: He is well-developed.   HENT:      Head: Normocephalic.      Nose: Nose normal.   Eyes:      Conjunctiva/sclera: Conjunctivae normal.   Cardiovascular:      Rate and Rhythm: Normal rate and regular rhythm.      Heart sounds: Normal heart sounds.   Pulmonary:      Effort: Pulmonary effort is normal.      Breath sounds: Normal breath sounds.   Musculoskeletal:         General: Normal range of motion.      Cervical back: Neck supple.   Skin:     General: Skin is warm and dry.   Neurological:      Mental Status: He is alert and oriented to person, place, and time.   Psychiatric:         Behavior: Behavior normal.         Thought Content: Thought content normal.         Judgment: Judgment normal.              Assessment and Plan:  1. Neuroendocrine tumor  CT CHEST WO CONTRAST      2. Encounter for follow-up surveillance of lung cancer  CT CHEST WO CONTRAST          No evidence of recurrence.     Return to clinic on or before 06/10/2023 for carcinoid surveillance with a ct chest wo contrast only.     Discussed future surveillance of annual scans x 10 yrs.       Micheal Likens, APRN  Cardiothoracic Surgery

## 2023-01-09 ENCOUNTER — Ambulatory Visit: Admit: 2023-01-09 | Discharge: 2023-01-09 | Payer: MEDICARE

## 2023-01-09 ENCOUNTER — Encounter: Admit: 2023-01-09 | Discharge: 2023-01-09 | Payer: MEDICARE

## 2023-01-09 DIAGNOSIS — M2550 Pain in unspecified joint: Secondary | ICD-10-CM

## 2023-01-09 DIAGNOSIS — N289 Disorder of kidney and ureter, unspecified: Secondary | ICD-10-CM

## 2023-01-09 DIAGNOSIS — I2510 Atherosclerotic heart disease of native coronary artery without angina pectoris: Secondary | ICD-10-CM

## 2023-01-09 DIAGNOSIS — I10 Essential (primary) hypertension: Secondary | ICD-10-CM

## 2023-01-09 DIAGNOSIS — M549 Dorsalgia, unspecified: Secondary | ICD-10-CM

## 2023-01-09 DIAGNOSIS — M5030 Other cervical disc degeneration, unspecified cervical region: Secondary | ICD-10-CM

## 2023-01-09 DIAGNOSIS — Z08 Encounter for follow-up examination after completed treatment for malignant neoplasm: Secondary | ICD-10-CM

## 2023-01-09 DIAGNOSIS — R42 Dizziness and giddiness: Secondary | ICD-10-CM

## 2023-01-09 DIAGNOSIS — J984 Other disorders of lung: Secondary | ICD-10-CM

## 2023-01-09 DIAGNOSIS — T148XXA Other injury of unspecified body region, initial encounter: Secondary | ICD-10-CM

## 2023-01-09 DIAGNOSIS — K2270 Barrett's esophagus without dysplasia: Secondary | ICD-10-CM

## 2023-01-09 DIAGNOSIS — C3490 Malignant neoplasm of unspecified part of unspecified bronchus or lung: Secondary | ICD-10-CM

## 2023-01-09 DIAGNOSIS — D3A8 Other benign neuroendocrine tumors: Secondary | ICD-10-CM

## 2023-01-09 DIAGNOSIS — M5134 Other intervertebral disc degeneration, thoracic region: Secondary | ICD-10-CM

## 2023-01-09 DIAGNOSIS — K219 Gastro-esophageal reflux disease without esophagitis: Secondary | ICD-10-CM

## 2023-01-09 DIAGNOSIS — M4800 Spinal stenosis, site unspecified: Secondary | ICD-10-CM

## 2023-01-09 DIAGNOSIS — G061 Intraspinal abscess and granuloma: Secondary | ICD-10-CM

## 2023-01-09 DIAGNOSIS — Z85118 Personal history of other malignant neoplasm of bronchus and lung: Secondary | ICD-10-CM

## 2023-01-09 DIAGNOSIS — E785 Hyperlipidemia, unspecified: Secondary | ICD-10-CM

## 2023-01-09 MED ORDER — IOHEXOL 350 MG IODINE/ML IV SOLN
100 mL | Freq: Once | INTRAVENOUS | 0 refills | Status: CP
Start: 2023-01-09 — End: ?
  Administered 2023-01-09: 15:00:00 100 mL via INTRAVENOUS

## 2023-01-09 MED ORDER — SODIUM CHLORIDE 0.9 % IJ SOLN
50 mL | Freq: Once | INTRAVENOUS | 0 refills | Status: CP
Start: 2023-01-09 — End: ?
  Administered 2023-01-09: 15:00:00 50 mL via INTRAVENOUS

## 2023-01-22 ENCOUNTER — Encounter: Admit: 2023-01-22 | Discharge: 2023-01-22 | Payer: MEDICARE

## 2023-01-22 ENCOUNTER — Ambulatory Visit: Admit: 2023-01-22 | Discharge: 2023-01-22 | Payer: MEDICARE

## 2023-01-22 DIAGNOSIS — I2510 Atherosclerotic heart disease of native coronary artery without angina pectoris: Secondary | ICD-10-CM

## 2023-01-22 DIAGNOSIS — K219 Gastro-esophageal reflux disease without esophagitis: Secondary | ICD-10-CM

## 2023-01-22 DIAGNOSIS — N289 Disorder of kidney and ureter, unspecified: Secondary | ICD-10-CM

## 2023-01-22 DIAGNOSIS — C3490 Malignant neoplasm of unspecified part of unspecified bronchus or lung: Secondary | ICD-10-CM

## 2023-01-22 DIAGNOSIS — E785 Hyperlipidemia, unspecified: Secondary | ICD-10-CM

## 2023-01-22 DIAGNOSIS — D3A8 Other benign neuroendocrine tumors: Secondary | ICD-10-CM

## 2023-01-22 DIAGNOSIS — T148XXA Other injury of unspecified body region, initial encounter: Secondary | ICD-10-CM

## 2023-01-22 DIAGNOSIS — E7800 Pure hypercholesterolemia, unspecified: Secondary | ICD-10-CM

## 2023-01-22 DIAGNOSIS — M4800 Spinal stenosis, site unspecified: Secondary | ICD-10-CM

## 2023-01-22 DIAGNOSIS — R42 Dizziness and giddiness: Secondary | ICD-10-CM

## 2023-01-22 DIAGNOSIS — M5134 Other intervertebral disc degeneration, thoracic region: Secondary | ICD-10-CM

## 2023-01-22 DIAGNOSIS — K2270 Barrett's esophagus without dysplasia: Secondary | ICD-10-CM

## 2023-01-22 DIAGNOSIS — M549 Dorsalgia, unspecified: Secondary | ICD-10-CM

## 2023-01-22 DIAGNOSIS — I10 Essential (primary) hypertension: Secondary | ICD-10-CM

## 2023-01-22 DIAGNOSIS — M5030 Other cervical disc degeneration, unspecified cervical region: Secondary | ICD-10-CM

## 2023-01-22 DIAGNOSIS — M2550 Pain in unspecified joint: Secondary | ICD-10-CM

## 2023-01-22 DIAGNOSIS — G061 Intraspinal abscess and granuloma: Secondary | ICD-10-CM

## 2023-01-22 DIAGNOSIS — J984 Other disorders of lung: Secondary | ICD-10-CM

## 2023-01-22 MED ORDER — LOSARTAN-HYDROCHLOROTHIAZIDE 50-12.5 MG PO TAB
1 | ORAL_TABLET | Freq: Every morning | ORAL | 1 refills | 28.00000 days | Status: AC
Start: 2023-01-22 — End: ?

## 2023-01-22 NOTE — Patient Instructions
Follow-Up:    -Thank you for allowing [place] to participate in your care today. Your After Visit Summary is being completed by Candise Bowens, RN.    -We would like you to follow up in  6 months with Francine Graven, MD  -The schedule is released approximately 4-5 months in advance. You will be called by our scheduling department to make an appointment and you will also receive a notification via MyChart to self-schedule.  However, if you would like to call to make this appointment, please call [phone].    Changes From Today's Office Visit  Start taking losartan-hydrochlorothiazide 50-12.5mg  daily  Please complete the following labs at the lab location of your choice in 2 months: metabolic profile and lipid profile. FAST FOR 8-10 HOURS PRIOR     Contacting our office:    -Business Hours: Monday-Friday, 8:00 am-4:30 pm (excluding Holidays).     -For medical questions or concerns, please send [place] a message through your MyChart account or call the [facility] team nursing triage line at [phone]. Please leave a detailed message with your name, date of birth, and reason for your call.  If your message is received before 3:30pm, every effort will be made to call you back the same day.  Please allow time for [place] to review your chart prior to call back.     -For medication refills please start by contacting your pharmacy. You can also send [place] a prescription question through your MyChart or call the nurse triage line above.     -Should you have an immediate need of the weekend/nights and holidays, please call our on-call triage line at [phone].    [facility] nursing team fax number: [phone]    -You may receive a survey in the upcoming weeks from [facility] [place] [facility]. Your feedback is important to [place] and helps [place] continue to improve patient care and patient satisfaction.     -Please feel free to call our Financial Department at [phone] with any questions or concerns about estimated cost of testing or imaging ordered today. We are happy to provide CPT codes upon request.    Results & Testing Follow Up:    -Please allow 5-7 business days for the results of any testing to be reviewed. Please call our office if you have not heard from a nurse within this time frame.    -Should you choose to complete testing at an outside facility, please contact our office after completion of testing so that we can ensure that we have received results for your provider to review.    Lab and test results:  As a part of the CARES act, starting 10/02/2019, some results will be released to you via MyChart immediately and automatically.  You may see results before your provider sees them; however, your provider will review all these results and then they, or one of their team, will notify you of result information and recommendations.   Critical results will be addressed immediately, but otherwise, please allow [place] time to get back with you prior to you reaching out to [place] for questions.  This will usually take about 72 hours for labs and 5-7 days for procedure test results.

## 2023-01-22 NOTE — Progress Notes
Date of Service: 01/22/2023    Darin [name] is a 62 y.o. male.       HPI  The patient is doing very well clinically.  His blood pressure is running a little high at 144/88 today.  He says his blood pressure tends to run in the 140s systolic at home.  His BMI is 33.  His chest discomforts have completely resolved.  We went over his coronary angiogram results with he and his wife today.  He underwent stenting in his proximal LAD.  It appears that his old stent was also in this region.  His symptoms are markedly improved.  His dyspnea has improved as well.  He still has mild exertional dyspnea.  He is trying to be more active and exercise more.  He has had no palpitations or syncope.  He has had no bleeding.  He has had no edema.  He gets a little dizzy at times.  His exam is otherwise unremarkable.       The patient, with a history of heart disease and a recent stent placement, reports improvement in his cardiac symptoms. He denies any chest pain and describes his heart condition as better. He had a stent placed in a major artery, which was 80% blocked. The patient recalls a similar procedure performed 21 years ago, suggesting a history of chronic heart disease. He also mentions a family history of heart disease and pancreatic cancer.    The patient reports occasional shortness of breath, which he attributes to increased physical activity. He denies any difficulty walking, palpitations, syncope, edema, or bleeding. However, he does report occasional dizziness upon standing up quickly or stretching, which has improved with increased fluid intake.    The patient's blood pressure has been consistently high, typically in the 140s over 80s. He is currently on amlodipine for blood pressure control, but the medication regimen may need adjustment due to the persistently elevated readings.    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 He reports no current respiratory symptoms and recent imaging studies show no signs of cancer recurrence. He is currently on a regimen of Repatha shots, baby aspirin, clopidogrel, and Neurontin.    The patient is generally active, engaging in yard work and other household activities.         RADIOLOGY  Carotid ultrasound: No significant plaque (01/19/2023)  Chest CT: No sign of malignancy in chest or abdomen (01/19/2023)    DIAGNOSTIC  Cardiac catheterization: 80% stenosis in the main coronary artery, stent placed            Vitals:    01/22/23 1545   BP: (!) 144/88   BP Source: Arm, Left Upper   Pulse: 58   SpO2: 96%   O2 Device: None (Room air)   PainSc: Zero   Weight: 98.9 kg (218 lb)   Height: 172.7 cm (5' 8)     Body mass index is 33.15 kg/m?Marland Kitchen     Past Medical History  Patient Active Problem List    Diagnosis Date Noted    Difficult airway 06/09/2022    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  No other significant epicardial or coronary lesions identified.  08/22/2012 Heart Cath: Mild instent restenosis in LAD with negative FFR measurement of 0.92.  No other significant lesions seen in LAD or diag branches.  Mild atherosclerosis RCA no significant lesions.  Normal LV contractility and EF.  07/23/2014 Cardiolite stress test: Normal myocardial perfusion study.  No evidence for significant ischemia or scar noted.  Normal LV function.      Hyperlipemia 09/14/2017    Essential hypertension 09/14/2017 Review of Systems   Constitutional: Negative. Negative for decreased appetite, malaise/fatigue, weight gain and weight loss.   HENT: Negative.  Negative for hearing loss and nosebleeds.    Eyes: Negative.  Negative for vision loss in left eye and vision loss in right eye.   Cardiovascular: Negative.  Negative for chest pain, dyspnea on exertion, leg swelling and syncope.   Respiratory: Negative.  Negative for cough and shortness of breath.    Endocrine: Negative.  Negative for cold intolerance and heat intolerance.   Hematologic/Lymphatic: Negative.  Negative for adenopathy and bleeding problem. Does not bruise/bleed easily.   Skin: Negative.  Negative for color change, rash and suspicious lesions.   Musculoskeletal: Negative.  Negative for joint swelling and muscle weakness.   Gastrointestinal: Negative.  Negative for hematochezia and melena.   Genitourinary: Negative.  Negative for hematuria.   Neurological:  Positive for dizziness, light-headedness and loss of balance. Negative for vertigo.   Psychiatric/Behavioral: Negative.  Negative for altered mental status and memory loss.    Allergic/Immunologic: Negative.  Negative for environmental allergies.       Physical Exam   Nursing note and vitals reviewed.  Constitutional: He appears well-developed.  Non-toxic appearance. No distress.   HENT:   Head: Normocephalic and atraumatic.   Nose: Nose normal.   Mouth/Throat: Mucous membranes are moist.   Eyes: Right eye exhibits no discharge. Left eye exhibits no discharge. No scleral icterus.   Neck: No JVD present. Carotid bruit is not present. No thyromegaly present.   Cardiovascular: Normal rate and regular rhythm. Exam reveals no gallop and no S3.   No murmur heard.  Pulmonary/Chest: Breath sounds normal. No stridor. No tachypnea. No respiratory distress. He has no wheezes. He has no rales. He exhibits no tenderness.   Abdominal: Soft. Normal appearance. He exhibits no distension and no mass. There is no splenomegaly or hepatomegaly. There is no abdominal tenderness. There is no guarding.   Musculoskeletal:         General: No swelling or tenderness.      Cervical back: Neck supple. No rigidity or tenderness.      Right lower leg: No edema.      Left lower leg: No edema.   Lymphadenopathy:     He has no cervical adenopathy.   Neurological: He is alert and oriented to person, place, and time. He is not disoriented. No cranial nerve deficit.   Skin: Skin is warm and dry. No bruising and no rash noted. He is not diaphoretic. No erythema. No jaundice.   Psychiatric: His behavior is normal. Mood, judgment and thought content normal. His mood appears not anxious. His affect is not inappropriate.         Cardiovascular Studies  Cardiovascular Health Factors  Vitals BP Readings from Last 3 Encounters:   01/22/23 (!) 144/88   01/09/23 (!) 149/73   11/13/22 136/77     Wt Readings from Last 3 Encounters:   01/22/23 98.9 kg (218 lb)   01/09/23 99.1 kg (218 lb 6.4 oz)   11/13/22 90.3 kg (199 lb)     BMI Readings from Last 3 Encounters:   01/22/23 33.15 kg/m?   01/09/23 33.21 kg/m?   11/13/22 30.26 kg/m?      Smoking Social History     Tobacco Use   Smoking Status Never   Smokeless Tobacco Never   Tobacco Comments    nO HX      Lipid Profile Cholesterol   Date Value Ref Range Status   10/30/2022 237  Final     HDL   Date Value Ref Range Status   10/30/2022 44  Final     LDL   Date Value Ref Range Status   10/30/2022 132  Final     Triglycerides   Date Value Ref Range Status   10/30/2022 305  Final      Blood Sugar Hemoglobin A1C   Date Value Ref Range Status   06/08/2022 5.5 4.0 - 5.7 % Final     Comment:     The ADA recommends that most patients with type 1 and type 2 diabetes maintain   an A1c level <7%.       Glucose   Date Value Ref Range Status   10/24/2022 116  Final   06/11/2022 108 (H) 70 - 100 MG/DL Final   16/04/9603 540 (H) 70 - 100 MG/DL Final     Glucose, POC   Date Value Ref Range Status   06/09/2022 159 (H) 70 - 100 MG/DL Final   98/05/9146 94 70 - 100 MG/DL Final          Problems Addressed Today  Encounter Diagnoses   Name Primary?    Coronary artery disease involving native heart, unspecified vessel or lesion type, unspecified whether angina present Yes    Pure hypercholesterolemia     Essential hypertension     Neuroendocrine tumor        Assessment and Plan       Coronary Artery Disease: Recent stent placement in a previously stented artery. No current chest pain.  -Continue current medications including Repatha, baby aspirin, and clopidogrel.  -Check cholesterol profile in September 2024.    Hypertension: Blood pressure running in the 140s/80s.  -Add Losartan-Hydrochlorothiazide 50/12.5mg  daily.  -Monitor blood pressure at home.    General Health Maintenance:  -Continue exercise and weight loss efforts.  -Follow-up in 6 months or sooner if symptoms worsen.         Impression  1.  Ischemic heart disease with remote coronary stenting in 2003 at the age of 89.  The patient subsequently underwent placement of an Onyx stent in his proximal LAD in May 2024.  2.  Structurally normal heart by echo in November 2023  3.  Hypertension  4.  Hypercholesterolemia with intolerance to statins  5.  History of cervical myelopathy with previous cervical surgeries for degenerative changes and previous abscess  6.  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  We have elected to add losartan/hydrochlorothiazide 50/12.5 mg once daily.  He will continue to monitor his blood pressures at home and let us know if his blood pressure runs high.  We will get a fasting CMP and lipid profile in 2 months.  We will see him back in 6 months.  He will call if he has additional problems in the interim.  He will continue to work on exercise and healthy diet.    Francine Graven MD Current Medications (including today's revisions)   amLODIPine (NORVASC) 5 mg tablet Take one tablet by mouth daily.    aspirin EC (ASPIR-LOW) 81 mg tablet Take one tablet by mouth daily.    carisoprodoL (SOMA) 350 mg tablet Take one tablet by mouth three times daily.    clopiDOGreL (PLAVIX) 75 mg tablet Take one tablet by mouth daily.    esomeprazole DR(+) (NEXIUM) 20 mg capsule Take one capsule by mouth every morning. Take on an empty stomach at least 1 hour before or 2 hours after food.    evolocumab (REPATHA SURECLICK) 140 mg/mL injectable PEN Inject 1 mL under the skin every 14 days.    fexofenadine (ALLEGRA) 180 mg tablet Take one tablet by mouth daily as needed.    gabapentin (NEURONTIN) 300 mg capsule Take three capsules by mouth three times daily.    HYDROcodone/acetaminophen (NORCO) 5/325 mg tablet Take one tablet to two tablets by mouth every 4 hours as needed. Do not start before June 29, 2022.    losartan-hydroCHLOROthiazide (HYZAAR) 50-12.5 mg tablet Take one tablet by mouth every morning.    methocarbamoL (ROBAXIN) 750 mg tablet Take one tablet by mouth three times daily. Indications: muscle spasm    tamsulosin (FLOMAX) 0.4 mg capsule Take two capsules by mouth daily. Take 30 min after same meal.  Do not cut/ crush/ chew. (Patient taking differently: Take two capsules by mouth at bedtime daily. Take 30 min after same meal.  Do not cut/ crush/ chew.)

## 2023-01-24 ENCOUNTER — Encounter: Admit: 2023-01-24 | Discharge: 2023-01-24 | Payer: MEDICARE

## 2023-01-25 ENCOUNTER — Encounter: Admit: 2023-01-25 | Discharge: 2023-01-25 | Payer: MEDICARE

## 2023-01-26 MED FILL — REPATHA SURECLICK 140 MG/ML SC PNIJ: 140 mg/mL | SUBCUTANEOUS | 28 days supply | Qty: 2 | Fill #3 | Status: AC

## 2023-02-27 ENCOUNTER — Encounter: Admit: 2023-02-27 | Discharge: 2023-02-27 | Payer: MEDICARE

## 2023-02-27 MED ORDER — REPATHA SURECLICK 140 MG/ML SC PNIJ
140 mg | SUBCUTANEOUS | 2 refills
Start: 2023-02-27 — End: ?

## 2023-02-28 ENCOUNTER — Encounter: Admit: 2023-02-28 | Discharge: 2023-02-28 | Payer: MEDICARE

## 2023-03-01 ENCOUNTER — Encounter: Admit: 2023-03-01 | Discharge: 2023-03-01 | Payer: MEDICARE

## 2023-03-02 ENCOUNTER — Encounter: Admit: 2023-03-02 | Discharge: 2023-03-02 | Payer: MEDICARE

## 2023-03-05 ENCOUNTER — Encounter: Admit: 2023-03-05 | Discharge: 2023-03-05 | Payer: MEDICARE

## 2023-03-06 MED FILL — REPATHA SURECLICK 140 MG/ML SC PNIJ: 140 mg/mL | SUBCUTANEOUS | 28 days supply | Qty: 2 | Fill #1 | Status: AC

## 2023-03-23 ENCOUNTER — Encounter: Admit: 2023-03-23 | Discharge: 2023-03-23 | Payer: MEDICARE

## 2023-03-25 ENCOUNTER — Encounter: Admit: 2023-03-25 | Discharge: 2023-03-25 | Payer: MEDICARE

## 2023-03-26 ENCOUNTER — Encounter: Admit: 2023-03-26 | Discharge: 2023-03-26 | Payer: MEDICARE

## 2023-03-26 MED FILL — REPATHA SURECLICK 140 MG/ML SC PNIJ: 140 mg/mL | SUBCUTANEOUS | 28 days supply | Qty: 2 | Fill #2 | Status: AC

## 2023-03-26 NOTE — Progress Notes
Sent [facility] reminder of labs due and fraxed orders to [facility] in [place] at [phone].    ----- Message -----  From: Leafy Half, RN  Sent: 03/25/2023   7:00 AM CDT  To: [facility] Nurse Interventional Team [place]  Subject: Lab REminder                                     Send pt lab reminder for labs ordered to be done in 2 months by Dr. Anda Kraft on 01/22/23 oV

## 2023-03-27 ENCOUNTER — Encounter: Admit: 2023-03-27 | Discharge: 2023-03-27 | Payer: MEDICARE

## 2023-03-27 DIAGNOSIS — E7800 Pure hypercholesterolemia, unspecified: Secondary | ICD-10-CM

## 2023-03-27 DIAGNOSIS — I10 Essential (primary) hypertension: Secondary | ICD-10-CM

## 2023-03-27 LAB — COMPREHENSIVE METABOLIC PANEL
ALBUMIN: 4.3 — ABNORMAL HIGH (ref <20.7)
ALK PHOSPHATASE: 65
ALT: 33
ANION GAP: 10
AST: 20
BLD UREA NITROGEN: 10 U/L (ref 7–56)
CALCIUM: 9.7 10*3/uL — ABNORMAL HIGH (ref 0–0.80)
CHLORIDE: 106 U/L — ABNORMAL HIGH (ref 7–40)
CO2: 25 MMOL/L — ABNORMAL LOW (ref 21–30)
CREATININE: 1.2 10*3/uL — ABNORMAL HIGH (ref 3–12)
GFR ESTIMATED: 65
GLUCOSE,PANEL: 104 mL/min — ABNORMAL LOW (ref >60)
POTASSIUM: 4.4 U/L — ABNORMAL HIGH (ref 25–110)
SODIUM: 141 g/dL — ABNORMAL LOW (ref 3.5–5.0)
TOTAL BILIRUBIN: 0.6 10*3/uL (ref 0–0.20)
TOTAL PROTEIN: 6.8 10*3/uL (ref 0–0.45)

## 2023-03-27 LAB — LIPID PROFILE
CHOLESTEROL: 132 M/UL (ref 4.0–5.0)
TRIGLYCERIDES: 212 g/dL (ref 12.0–15.0)

## 2023-04-16 ENCOUNTER — Encounter: Admit: 2023-04-16 | Discharge: 2023-04-16 | Payer: MEDICARE

## 2023-04-18 ENCOUNTER — Encounter: Admit: 2023-04-18 | Discharge: 2023-04-18 | Payer: MEDICARE

## 2023-04-20 ENCOUNTER — Encounter: Admit: 2023-04-20 | Discharge: 2023-04-20 | Payer: MEDICARE

## 2023-04-20 MED FILL — REPATHA SURECLICK 140 MG/ML SC PNIJ: 140 mg/mL | SUBCUTANEOUS | 28 days supply | Qty: 2 | Fill #3 | Status: AC

## 2023-04-30 ENCOUNTER — Encounter: Admit: 2023-04-30 | Discharge: 2023-04-30 | Payer: MEDICARE

## 2023-04-30 MED ORDER — REPATHA SURECLICK 140 MG/ML SC PNIJ
140 mg | SUBCUTANEOUS | 2 refills | 28.00000 days | Status: AC
Start: 2023-04-30 — End: ?
  Filled 2023-05-09: qty 2, 28d supply, fill #1

## 2023-05-02 ENCOUNTER — Encounter: Admit: 2023-05-02 | Discharge: 2023-05-02 | Payer: MEDICARE

## 2023-05-07 ENCOUNTER — Encounter: Admit: 2023-05-07 | Discharge: 2023-05-07 | Payer: MEDICARE

## 2023-05-09 ENCOUNTER — Encounter: Admit: 2023-05-09 | Discharge: 2023-05-09 | Payer: MEDICARE

## 2023-05-10 ENCOUNTER — Encounter: Admit: 2023-05-10 | Discharge: 2023-05-10 | Payer: MEDICARE

## 2023-05-30 NOTE — Progress Notes
Date of Service: 06/04/2023       Subjective:             Darin [name] is a 62 y.o. male.      History of Present Illness    Darin [name] returns to thoracic surgery clinic for follow up. He is a 62 y.o. 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 1 drug-eluting stent was successfully placed in that vessel.         CT chest today   1.  Stable right middle lobectomy changes without evidence of recurrent or   residual lung neoplasm.     2.  No thoracic lymphadenopathy.     3.  At least moderate coronary artery calcification with stents.         Patient is doing well, post op neuralgia improving. No breathing issues that have significantly changed. Tried to stay active. History of spinal cord injury, some nerve issues already. No hospitalizations. No major medical changes. No chest pain.      Review of Systems   Constitutional: Negative.   HENT: Negative.     Eyes: Negative.    Cardiovascular: Negative.    Respiratory: Negative.     Endocrine: Negative.    Hematologic/Lymphatic: Negative.    Skin: Negative.    Musculoskeletal: Negative.    Gastrointestinal: Negative.    Genitourinary: Negative.    Neurological: Negative.    Psychiatric/Behavioral: Negative.     Allergic/Immunologic: Negative.    All other systems reviewed and are negative.        Objective:          amLODIPine (NORVASC) 5 mg tablet Take one tablet by mouth daily.    aspirin EC (ASPIR-LOW) 81 mg tablet Take one tablet by mouth daily.    carisoprodoL (SOMA) 350 mg tablet Take one tablet by mouth three times daily.    clopiDOGreL (PLAVIX) 75 mg tablet Take one tablet by mouth daily.    esomeprazole DR(+) (NEXIUM) 20 mg capsule Take one capsule by mouth every morning. Take on an empty stomach at least 1 hour before or 2 hours after food.    evolocumab (REPATHA SURECLICK) 140 mg/mL injectable PEN Inject 1 mL under the skin every 14 days.    fexofenadine (ALLEGRA) 180 mg tablet Take one tablet by mouth daily as needed.    gabapentin (NEURONTIN) 300 mg capsule Take three capsules by mouth three times daily.    HYDROcodone/acetaminophen (NORCO) 5/325 mg tablet Take one tablet to two tablets by mouth every 4 hours as needed. Do not start before June 29, 2022.    losartan-hydroCHLOROthiazide (HYZAAR) 50-12.5 mg tablet Take one tablet by mouth every morning.    methocarbamoL (ROBAXIN) 750 mg tablet Take one tablet by mouth three times daily. Indications: muscle spasm    tamsulosin (FLOMAX) 0.4 mg capsule Take two capsules by mouth daily. Take 30 min after same meal.  Do not cut/ crush/ chew. (Patient taking differently: Take two capsules by mouth at bedtime daily. Take 30 min after same meal.  Do not cut/ crush/ chew.)     Vitals:    06/04/23 1407   BP: 130/76   BP Source: Arm, Left Upper   Pulse: 74   Temp: 36.2 ?C (97.2 ?  F)   SpO2: 97%   TempSrc: Temporal   PainSc: Seven   Weight: 94.8 kg (209 lb)   Height: 172.7 cm (5' 8)     Body mass index is 31.78 kg/m?Marland Kitchen     Physical Exam  Vitals reviewed.   Constitutional:       Appearance: He is well-developed.   HENT:      Head: Normocephalic.      Nose: Nose normal.   Eyes:      Conjunctiva/sclera: Conjunctivae normal.   Cardiovascular:      Rate and Rhythm: Normal rate and regular rhythm.      Heart sounds: Normal heart sounds.   Pulmonary:      Effort: Pulmonary effort is normal.      Breath sounds: Normal breath sounds.   Musculoskeletal:         General: Normal range of motion.      Cervical back: Neck supple.   Skin:     General: Skin is warm and dry.   Neurological:      Mental Status: He is alert and oriented to person, place, and time.   Psychiatric:         Behavior: Behavior normal.         Thought Content: Thought content normal.         Judgment: Judgment normal.              Assessment and Plan:  1. Neuroendocrine tumor (HCC)  CT CHEST W CONTRAST    CT ABD/PELV W CONTRAST      2. Encounter for follow-up surveillance of lung cancer  CT CHEST W CONTRAST    CT ABD/PELV W CONTRAST        No evidence of recurrence.     Return to clinic in 1 year for continued surveillance. He will have a CT C A P with contrast for surveillance.       Micheal Likens, APRN  Cardiothoracic Surgery

## 2023-06-03 ENCOUNTER — Encounter: Admit: 2023-06-03 | Discharge: 2023-06-03 | Payer: MEDICARE

## 2023-06-04 ENCOUNTER — Encounter: Admit: 2023-06-04 | Discharge: 2023-06-04 | Payer: MEDICARE

## 2023-06-04 ENCOUNTER — Ambulatory Visit: Admit: 2023-06-04 | Discharge: 2023-06-04 | Payer: MEDICARE

## 2023-06-05 DIAGNOSIS — Z08 Encounter for follow-up examination after completed treatment for malignant neoplasm: Secondary | ICD-10-CM

## 2023-06-05 DIAGNOSIS — D3A8 Other benign neuroendocrine tumors: Secondary | ICD-10-CM

## 2023-06-05 DIAGNOSIS — Z85118 Personal history of other malignant neoplasm of bronchus and lung: Secondary | ICD-10-CM

## 2023-06-06 ENCOUNTER — Encounter: Admit: 2023-06-06 | Discharge: 2023-06-06 | Payer: MEDICARE

## 2023-06-11 ENCOUNTER — Encounter: Admit: 2023-06-11 | Discharge: 2023-06-11 | Payer: MEDICARE

## 2023-06-12 ENCOUNTER — Encounter: Admit: 2023-06-12 | Discharge: 2023-06-12 | Payer: MEDICARE

## 2023-06-12 NOTE — Progress Notes
Pharmacy Benefits Investigation    Medication name: evolocumab (REPATHA SURECLICK) 140 mg/mL injectable PEN  Medication status: new    The insurance requires a prior authorization for the medication. The prior authorization was submitted via CoverMyMeds.    PA number: [id number]    Lucie Leather  Specialty Pharmacy Patient Advocate

## 2023-06-15 ENCOUNTER — Encounter: Admit: 2023-06-15 | Discharge: 2023-06-15 | Payer: MEDICARE

## 2023-06-18 ENCOUNTER — Encounter: Admit: 2023-06-18 | Discharge: 2023-06-18 | Payer: MEDICARE

## 2023-06-19 ENCOUNTER — Encounter: Admit: 2023-06-19 | Discharge: 2023-06-19 | Payer: MEDICARE

## 2023-06-20 ENCOUNTER — Encounter: Admit: 2023-06-20 | Discharge: 2023-06-20 | Payer: MEDICARE

## 2023-06-20 DIAGNOSIS — M542 Cervicalgia: Secondary | ICD-10-CM

## 2023-06-20 NOTE — Progress Notes
Pharmacy Benefits Investigation    Medication name: evolocumab (REPATHA SURECLICK) 140 mg/mL injectable PEN  Medication status: new    The prior authorization was approved for Darin [name] (PA number [id number]) from 06/12/2023 through 07/02/2024.    The out of pocket cost today is $132.31 for 28 days. This cost may change due to factors including but not limited to changes in insurance coverage.    A grant is available through [employer].    After assistance, the final out of pocket cost is $0.    The copay is affordable. Per patient's request, the medication will be shipped to the patient's address.    Lucie Leather  Specialty Pharmacy Patient Advocate

## 2023-06-21 ENCOUNTER — Encounter: Admit: 2023-06-21 | Discharge: 2023-06-21 | Payer: MEDICARE

## 2023-06-21 ENCOUNTER — Ambulatory Visit: Admit: 2023-06-21 | Discharge: 2023-06-21 | Payer: MEDICARE

## 2023-06-21 ENCOUNTER — Ambulatory Visit: Admit: 2023-06-21 | Discharge: 2023-06-22

## 2023-06-21 DIAGNOSIS — R200 Anesthesia of skin: Secondary | ICD-10-CM

## 2023-06-21 DIAGNOSIS — R29898 Other symptoms and signs involving the musculoskeletal system: Secondary | ICD-10-CM

## 2023-06-21 DIAGNOSIS — Z981 Arthrodesis status: Secondary | ICD-10-CM

## 2023-06-21 DIAGNOSIS — M5412 Radiculopathy, cervical region: Secondary | ICD-10-CM

## 2023-06-21 NOTE — Progress Notes
Marc A. Clydene Pugh, MD [facility]  Follow - Up Visit  Subjective     REASON FOR VISIT   Pain and Stiffness of the Neck; Pain, Numbness, and Weakness of the Right Arm; and Pain, Numbness, and Weakness of the Right Hand    SUBJECTIVE     Mr. Lefevre presents today for evaluation.  He is now 3 years 1 month s/p C5-6 ACDF on 05/14/2020.  He reports he had severe pain at the base of his skull running down the right side of his neck into his shoulder.  There is a spasm that runs up the side of his back more so on the right.  The neck started over a year ago.  He thought he would just deal with it for a while.  The shoulder and arm started last spring/summer.  He has some weakness of his right arm and hand, numbness in the hand and back of the upper arm.  He reports starting earlier this week his arm shakes pretty intensely when he lifts his arm.  Prior to surgery in 2021 he had the shakiness, including in the right leg, which he said is back to acting up now.         ROS: Review of Systems   Musculoskeletal:  Positive for arthralgias, neck pain and neck stiffness.   Neurological:  Positive for weakness and numbness.     A 10-point ROS was performed and negative.    PHYSICAL EXAM   Blood pressure 134/80, pulse 53, temperature 36.1 ?C (97 ?F), temperature source Temporal, height 172.7 cm (5' 8), weight 95.4 kg (210 lb 6.4 oz), SpO2 99%.  Body mass index is 31.99 kg/m?Marland Kitchen  Oswestry Total Score:: (Patient-Rptd) 60  Pain Score: Seven    Constitutional: Alert, NAD  Head: Atraumatic  Eyes: EOMI  Respiratory: Unlabored breathing  Cardiovascular: Regular rate  Skin: No rashes or open wounds appreciated on back  Musculoskeletal: Strength stable  Neurologic: Sensation stable    Upper Extremity Motor Exam   Deltoid Biceps Triceps WF WE Intrinsics Grip   RIGHT   4 4 4 4 4 4 4    LEFT 5 5 5 5 5 5 5       There was significant clonic type activity with the examination of the RUE with all muscle groups. There was quite a bit of giving way with muscle testing. The exam was limited due to this clonic activity.    RADIOGRAPHS       SCOLIOSIS EOS AP/LAT  Narrative: Scoliosis Survey.    CLINICAL INDICATION: 62 years Male; Neck and back pain.    COMPARISON: 12/09/2019, SCOLIOSIS EOS AP/LAT  Impression: 1.  Unchanged ACDF C3-C4, C4-C5, with nearly fully incorporated hardware at C5-C6.    2.  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    3.  Otherwise mild diffuse thoracic and lumbar spondylosis.     Finalized by Newman Nickels, M.D. on 06/21/2023 1:22 PM. Dictated by Newman Nickels, M.D. on 06/21/2023 1:18 PM.            ASSESSMENT / PLAN     Basheer Ragucci Mckinny is a 62 y.o. male with     1. History of fusion of cervical spine  MRI C-SPINE WO CONTRAST      2. Cervical radiculopathy  MRI C-SPINE WO CONTRAST      3. Right arm weakness  MRI C-SPINE WO CONTRAST      4. Numbness and tingling  in right hand  MRI C-SPINE WO CONTRAST          His previous fusion is solid.  I think we need to look into things further with an MRI of his cervical spine.  This diagnostic study will allow me to make accurate and meaningful decisions for future treatment recommendations.  We will follow up with the patient after this study is completed to review and make further recommendations.       In the presence of Marcelline Deist, MD , I have taken down these notes, Mamie Laurel, Scribe. June 21, 2023 4:09 PM    Apolinar Junes B. Lisette Grinder, MD, MPH  Spinal Surgery  [place]. Clydene Pugh MD, [facility]  Nurse: Laverle Patter, BSN, RN, CNOR   [phone]  -  [email or web address]

## 2023-06-25 ENCOUNTER — Encounter: Admit: 2023-06-25 | Discharge: 2023-06-25 | Payer: MEDICARE

## 2023-06-25 MED FILL — REPATHA SURECLICK 140 MG/ML SC PNIJ: 140 mg/mL | SUBCUTANEOUS | 28 days supply | Qty: 2 | Fill #2 | Status: AC

## 2023-07-02 ENCOUNTER — Encounter: Admit: 2023-07-02 | Discharge: 2023-07-02 | Payer: MEDICARE

## 2023-07-09 ENCOUNTER — Encounter: Admit: 2023-07-09 | Discharge: 2023-07-09 | Payer: MEDICARE

## 2023-07-11 ENCOUNTER — Encounter: Admit: 2023-07-11 | Discharge: 2023-07-11 | Payer: MEDICARE

## 2023-07-17 ENCOUNTER — Encounter: Admit: 2023-07-17 | Discharge: 2023-07-17 | Payer: MEDICARE

## 2023-07-18 ENCOUNTER — Encounter: Admit: 2023-07-18 | Discharge: 2023-07-18 | Payer: MEDICARE

## 2023-07-18 NOTE — Progress Notes
Pharmacy Benefits Investigation    Medication name: evolocumab (REPATHA SURECLICK) 140 mg/mL injectable PEN  Medication status: continuation (refill)    The insurance requires a prior authorization for the medication. The prior authorization was submitted via CoverMyMeds.    PA number: Darin [name]  Specialty Pharmacy Patient Advocate

## 2023-07-18 NOTE — Progress Notes
Pharmacy Benefits Investigation    Medication name: evolocumab (REPATHA SURECLICK) 140 mg/mL injectable PEN  Medication status: continuation (refill)    The prior authorization was approved for Darin [name] (PA number [id number]) from 07/18/2023 through 07/02/2024.    The out of pocket cost today is $565.8 for 28 days. This cost may change due to factors including but not limited to changes in insurance coverage.    A grant is available through [employer].    After assistance, the final out of pocket cost is $0.    The copay is affordable. Per patient's request, the medication will be shipped to the patient's address.    Lucie Leather  Specialty Pharmacy Patient Advocate

## 2023-07-19 ENCOUNTER — Encounter: Admit: 2023-07-19 | Discharge: 2023-07-19 | Payer: MEDICARE

## 2023-07-19 MED FILL — REPATHA SURECLICK 140 MG/ML SC PNIJ: 140 mg/mL | SUBCUTANEOUS | 28 days supply | Qty: 2 | Fill #3 | Status: AC

## 2023-07-27 ENCOUNTER — Encounter: Admit: 2023-07-27 | Discharge: 2023-07-27 | Payer: MEDICARE

## 2023-07-27 ENCOUNTER — Ambulatory Visit: Admit: 2023-07-27 | Discharge: 2023-07-28 | Payer: MEDICARE

## 2023-08-01 ENCOUNTER — Encounter: Admit: 2023-08-01 | Discharge: 2023-08-01 | Payer: MEDICARE

## 2023-08-03 ENCOUNTER — Encounter: Admit: 2023-08-03 | Discharge: 2023-08-03 | Payer: MEDICARE

## 2023-08-06 ENCOUNTER — Encounter: Admit: 2023-08-06 | Discharge: 2023-08-06 | Payer: MEDICARE

## 2023-08-08 ENCOUNTER — Encounter: Admit: 2023-08-08 | Discharge: 2023-08-08 | Payer: MEDICARE

## 2023-08-09 ENCOUNTER — Encounter: Admit: 2023-08-09 | Discharge: 2023-08-09 | Payer: MEDICARE

## 2023-08-09 MED FILL — REPATHA SURECLICK 140 MG/ML SC PNIJ: 140 mg/mL | SUBCUTANEOUS | 28 days supply | Qty: 2 | Fill #1 | Status: AC

## 2023-08-10 ENCOUNTER — Encounter: Admit: 2023-08-10 | Discharge: 2023-08-10 | Payer: MEDICARE

## 2023-08-10 ENCOUNTER — Ambulatory Visit: Admit: 2023-08-10 | Discharge: 2023-08-10 | Payer: MEDICARE

## 2023-08-10 DIAGNOSIS — I728 Aneurysm of other specified arteries: Secondary | ICD-10-CM

## 2023-08-10 NOTE — Progress Notes
 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    He has no abdominal pain back plain flank pain groin pain.    I independent reviewed his CT scan which was without contrast so difficult to evaluate the main splenic artery but it appears to be of normal size near the hilum on the lower portion of the spleen there is a small peripherally calcified focal aneurysm is maybe 1 cm at biggest.        Past Medical History:    Acid reflux    Adverse drug reaction    Back pain    Barrett's esophagus    Cancer of lung (HCC)    Coronary artery disease    Coronary atherosclerosis    Degenerative disc disease, cervical    Degenerative disc disease, thoracic    Difficult intubation    Dizziness    Dyspnea    Essential hypertension    Hyperlipemia    Joint pain    Kidney disease    Lung disease    Nerve injury    Other malignant neoplasm without specification of site    Spinal cord abscess    Spinal stenosis       Surgical History:   Procedure Laterality Date    HX BACK SURGERY  1987    X2 for abscess after epidural injection    HX HEART CATHETERIZATION  2003    with stent placement x 1 on left side    BICEPS TENDON REPAIR  2005    SPINAL FUSION  2016    c3-c5 in [place]    ANTERIOR CERVICAL DISCECTOMY AND FUSION CERVICAL 5-6,  CAGE PLACEMENT CERVICAL 5-6 N/A 05/14/2020    Performed by Marcelline Deist, MD at [facility] OR    ANTERIOR INSTRUMENTATION - 2 TO 3 VERTEBRAL SEGMENTS N/A 05/14/2020    Performed by Marcelline Deist, MD at [facility] OR    ROBOT ASSISTED FLEXIBLE  BRONCHOSCOPY WITH IMAGE-GUIDED NAVIGATION- FLEXIBLE N/A 04/28/2022    Performed by Boykin Nearing, Maykol R, MD at [facility] OR    BRONCHOSCOPY DIAGNOSTIC WITH/ WITHOUT CELL WASHING - FLEXIBLE N/A 04/28/2022    Performed by Boykin Nearing, Maykol R, MD at [facility] OR    BRONCHOSCOPY WITH TRANSBRONCHIAL LUNG BIOPSY - FLEXIBLE - SINGLE LOBE N/A 04/28/2022    Performed by Boykin Nearing, Maykol R, MD at [facility] OR    BRONCHOSCOPY WITH ENDOBRONCHIAL ULTRASOUND GUIDED TRANSTRACHEAL/ TRANSBRONCHIAL SAMPLING - 3 OR MORE MEDIASTINAL/ HILAR LYMPH NODE STATIONS/ STRUCTURE - FLEXIBLE N/A 04/28/2022    Performed by Boykin Nearing, Maykol R, MD at [facility] OR    Robotic assisted video assisted thoracoscopy with RIGHT MIDDLE LOBE lobectomy, bronchoscopy Right 06/09/2022    Performed by Arlyce Harman, MD at [facility] CVOR    Right robotic asissted THORACOSCOPY WITH MEDIASTINAL AND REGIONAL LYMPHADENECTOMY Right 06/09/2022    Performed by Arlyce Harman, MD at [facility] CVOR    BRONCHOSCOPY  DIAGNOSTIC - FLEXIBLE N/A 06/09/2022    Performed by Arlyce Harman, MD at [facility] CVOR    ANGIOGRAPHY CORONARY ARTERY WITH LEFT HEART CATHETERIZATION N/A 11/03/2022    Performed by Roderic Palau, MD at [facility] CATH LAB    PERCUTANEOUS CORONARY STENT PLACEMENT WITH ANGIOPLASTY N/A 11/03/2022    Performed by Roderic Palau, MD at [facility] CATH LAB    BACLOFEN PUMP IMPLANTATION      since removed    CARDIAC CATHERIZATION  04/18/2002    CARDIOVASCULAR STRESS TEST  05/16/2022    COLONOSCOPY      CORONARY ANGIOPLASTY  04/18/2002    ECHOCARDIOGRAM PROCEDURE  05/11/2022    HX CORONARY STENT PLACEMENT  04/18/2002    HX TONSILLECTOMY  Unknown    LAMINECTOMY  12/27/1985    UMBILICAL ARTERIAL CATH - BEDSIDE  04/2002 11/2022       Allergies:  Allergies   Allergen Reactions    Clonazepam RASH    Morphine VOMITING       Medication List:   aspirin EC (ASPIR-LOW) 81 mg tablet Take one tablet by mouth daily.    carisoprodoL (SOMA) 350 mg tablet Take one tablet by mouth three times daily.    esomeprazole DR(+) (NEXIUM) 20 mg capsule Take one capsule by mouth every morning. Take on an empty stomach at least 1 hour before or 2 hours after food.    evolocumab (REPATHA SURECLICK) 140 mg/mL injectable PEN Inject 1 mL under the skin every 14 days.    fexofenadine (ALLEGRA) 180 mg tablet Take one tablet by mouth daily as needed.    gabapentin (NEURONTIN) 300 mg capsule Take three capsules by mouth three times daily.    HYDROcodone/acetaminophen (NORCO) 5/325 mg tablet Take one tablet to two tablets by mouth every 4 hours as needed. Do not start before June 29, 2022.    tamsulosin (FLOMAX) 0.4 mg capsule Take two capsules by mouth daily. Take 30 min after same meal.  Do not cut/ crush/ chew. (Patient taking differently: Take two capsules by mouth at bedtime daily. Take 30 min after same meal.  Do not cut/ crush/ chew.)       Social History:   reports that he has never smoked. He has never used smokeless tobacco. He reports that he does not currently use alcohol. He reports that he does not use drugs.    Family History   Problem Relation Name Age of Onset    Alzheimer's Mother      Coronary Artery Disease Father Father         2 bypass surgeries, clog coratid artery I    Cancer Father Father         Pancreatic cancer    Heart Disease Brother Richard     Diabetes Brother Richard     Heart problem Brother Richard         Triple bypass    Premature Heart Disease Brother Richard         Bypass surgery 2021    None Reported Son      None Reported Maternal Grandmother      None Reported Maternal Grandfather      Cancer-Colon Paternal Grandmother      Heart Disease Paternal Grandfather      Heart Disease Brother Peyton Najjar     Heart Attack Brother Peyton Najjar     Heart problem Brother Peyton Najjar     Cancer Brother Peyton Najjar        Review of Systems  Vitals:    08/10/23 1412 08/10/23 1414   BP: (!) 143/95 131/87   BP Source: Arm, Right Upper Arm, Left Upper   Pulse: 66 60   Temp: 36.6 ?C (97.9 ?F)    TempSrc: Temporal    PainSc: Eight    Weight: 94.9 kg (209 lb 3.2 oz) Height: 172.7 cm (5' 8)      Body mass index is 31.81 kg/m?Marland Kitchen     Physical Exam  Normocephalic atraumatic no scleral icterus  Neck supple no mass no bruit  Chest clear  Abdomen soft nontender no masses no prominent aortic pulsation  Femoral and posterior tibial pulses 2+ bilaterally I am unable to appreciate prominent popliteal pulses.    Assessment and Plan:    1. Splenic artery aneurysm (HCC)  CTA CHEST WO/W CONT      2. Aneurysm of splenic artery (HCC)               Small asymptomatic distal splenic artery aneurysm as an incidental finding on CT scan.    No need for surgery or intervention at this time.    We discussed the [facility] practice guideline recommendations for repair of aneurysms above 3 cm some consideration given above 2.5 cm or symptomatic.    At this point this is very small and asymptomatic I did another CT scan with contrast in about 3 years.    He can have normal physical activity no limitations

## 2023-08-31 ENCOUNTER — Encounter: Admit: 2023-08-31 | Discharge: 2023-08-31 | Payer: MEDICARE

## 2023-09-03 ENCOUNTER — Encounter: Admit: 2023-09-03 | Discharge: 2023-09-03 | Payer: MEDICARE

## 2023-09-06 ENCOUNTER — Encounter: Admit: 2023-09-06 | Discharge: 2023-09-06 | Payer: MEDICARE

## 2023-09-06 ENCOUNTER — Ambulatory Visit: Admit: 2023-09-06 | Discharge: 2023-09-07 | Payer: MEDICARE

## 2023-09-06 DIAGNOSIS — R200 Anesthesia of skin: Secondary | ICD-10-CM

## 2023-09-06 DIAGNOSIS — R29898 Other symptoms and signs involving the musculoskeletal system: Secondary | ICD-10-CM

## 2023-09-06 DIAGNOSIS — M5412 Radiculopathy, cervical region: Secondary | ICD-10-CM

## 2023-09-06 DIAGNOSIS — Z981 Arthrodesis status: Secondary | ICD-10-CM

## 2023-09-06 DIAGNOSIS — M542 Cervicalgia: Secondary | ICD-10-CM

## 2023-09-06 NOTE — Progress Notes
 Marc A. Clydene Pugh, MD [facility]  Follow - Up Visit  Subjective     REASON FOR VISIT   Pain of the Neck, Pain of the Right Arm, and Pain of the Right Shoulder    SUBJECTIVE     Patient follows up for MRI via telephone.  He reports symptoms are unchanged.  Patient complains of right upper extremity pain as well as right sided neck pain.  He also reports some weakness and numbness.  He describes worsening right lower extremity symptoms that have worsened over the last few months.  History of ACDF by Dr. Lisette Grinder at C5-6 in 2021; previous ACDF at C 3-4 and 4-5.  He is wanting to know what his options are for treatment.       ROS: Review of Systems  A 10-point ROS was performed and negative.    PHYSICAL EXAM   There were no vitals taken for this visit.  There is no height or weight on file to calculate BMI.          Constitutional: Alert, NAD  Head: Atraumatic  Eyes: EOMI  Respiratory: Unlabored breathing  Cardiovascular: Regular rate  Skin: No rashes or open wounds appreciated on back  Musculoskeletal: Strength stable  Neurologic: Sensation stable    NT due to telephone encounter     RADIOGRAPHS       MRI C-SPINE WO CONTRAST  Narrative: EXAM: MRI C-SPINE    HISTORY: Right arm weakness. History of cervical fusion.    Technique: Multiple sagittal and axial MR sequences were obtained of the cervical spine.    Comparison: MRI C-spine 12/23/2019. Spine radiograph 06/21/2023.    FINDINGS:     Prior ACDF C3-C4, C4-C5 and C5-C6. Artifact from hardware limits evaluation of adjacent structures.    Normal cervical alignment. Vertebral body heights are maintained. Normal marrow signal. 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 Unremarkable paraspinous soft tissues.    C2-C3: Facet and uncovertebral hypertrophy. Mild-to-moderate right neural foraminal stenosis.  C3-C4: Posterior disc osteophyte complex. Facet and uncovertebral hypertrophy. Mild central spinal and moderate right neural foraminal stenosis similar to the prior study.  C4-C5: Posterior disc osteophyte complex. Facet and uncovertebral hypertrophy. Moderate central spinal and bilateral neural foraminal stenosis.  C5-C6: Posterior disc osteophyte complex. Facet and uncovertebral hypertrophy. Limited evaluation of the central canal due to metallic artifact. Likely moderate to severe bilateral neural foraminal stenosis.  C6-C7: Posterior disc osteophyte complex. Facet and uncovertebral hypertrophy. Mildly limited evaluation due to metallic artifact. Moderate central spinal stenosis, likely moderate to severe bilateral neural foraminal stenosis.  C7-T1: Posterior disc osteophyte complex. Facet and uncovertebral hypertrophy. Mild central spinal stenosis. Foramina are patent.  Impression: 1.  Exam limited by metallic artifact. Multilevel posterior disc osteophyte complexes and facet/uncovertebral hypertrophy results in central spinal and bilateral neural foraminal stenosis as above.  2.  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    By my electronic signature, I attest that I have personally reviewed the images for this examination and formulated the interpretations and opinions expressed in this report     Finalized by Shanna Cisco, M.D. on 07/27/2023 4:35 PM. Dictated by Tally Due, MD on 07/27/2023 3:21 PM.            ASSESSMENT / PLAN     Henrique Parekh Ryall is a 63 y.o. male with     1. History of fusion of cervical  spine        2. Cervical radiculopathy        3. Right arm weakness        4. Numbness and tingling in right hand        5. Neck pain            Imaging reviewed today with the patient as well as Dr. Lisette Grinder show moderate central stenosis at C6-7 as well as moderate to severe foraminal narrowing on the right at C6-7.  Conservative versus surgical treatment options were both discussed and patient would like to avoid any injections.  We will proceed with a CT of the C-spine for further surgical planning.  He will follow-up with Dr. Lisette Grinder after these results are completed for further treatment recommendations.  Patient is in agreement with this plan         Coralie Common, PA-C   Physician Assistant to Maringouin B. Lisette Grinder, MD, MPH  Spinal Surgery  [place]iz [place]. Clydene Pugh MD, [facility]  Nurse: Laverle Patter, BSN, RN, CNOR   [phone]  -  [email or web address]

## 2023-09-10 ENCOUNTER — Encounter: Admit: 2023-09-10 | Discharge: 2023-09-10 | Payer: MEDICARE

## 2023-09-11 MED FILL — REPATHA SURECLICK 140 MG/ML SC PNIJ: 140 mg/mL | SUBCUTANEOUS | 28 days supply | Qty: 2 | Fill #2 | Status: AC

## 2023-09-21 ENCOUNTER — Encounter: Admit: 2023-09-21 | Discharge: 2023-09-21 | Payer: MEDICARE

## 2023-09-21 NOTE — Telephone Encounter
 Received vm from [facility] Radiology Scheduling asking for authorization number for CT C-spine.  Returned call informing them we do not do authorizations for outside facilities.  Scheduler asked that clinic note be faxed to [phone] and they will do authorization.  Clinic notes faxed as requested

## 2023-10-01 ENCOUNTER — Encounter: Admit: 2023-10-01 | Discharge: 2023-10-01

## 2023-10-04 ENCOUNTER — Encounter: Admit: 2023-10-04 | Discharge: 2023-10-04

## 2023-10-08 ENCOUNTER — Encounter: Admit: 2023-10-08 | Discharge: 2023-10-08

## 2023-10-09 ENCOUNTER — Encounter: Admit: 2023-10-09 | Discharge: 2023-10-09

## 2023-10-10 MED FILL — REPATHA SURECLICK 140 MG/ML SC PNIJ: 140 mg/mL | SUBCUTANEOUS | 28 days supply | Qty: 2 | Fill #3 | Status: AC

## 2023-11-01 ENCOUNTER — Encounter: Admit: 2023-11-01 | Discharge: 2023-11-01 | Payer: MEDICARE

## 2023-11-01 MED ORDER — REPATHA SURECLICK 140 MG/ML SC PNIJ
140 mg | SUBCUTANEOUS | 2 refills | 28.00000 days | Status: AC
Start: 2023-11-01 — End: ?
  Filled 2023-11-07: qty 2, 28d supply, fill #1

## 2023-11-05 ENCOUNTER — Encounter: Admit: 2023-11-05 | Discharge: 2023-11-05 | Payer: MEDICARE

## 2023-11-06 ENCOUNTER — Encounter: Admit: 2023-11-06 | Discharge: 2023-11-06 | Payer: MEDICARE

## 2023-11-06 NOTE — Progress Notes
 Pharmacy Benefits Investigation    Medication name: evolocumab (REPATHA SURECLICK) 140 mg/mL injectable PEN  Medication status: continuation (refill)    The out of pocket cost today is $138.47 for 28 days. This cost may change due to factors including but not limited to changes in insurance coverage.     A grant was obtained and will provide the patient with $2500 per 12 months through 10/28/2024.    After assistance, the final out of pocket cost is $0.    The copay is affordable. Per patient's request, the medication will be shipped to the patient's address.  Alexia Angelucci  Specialty Pharmacy Patient Advocate

## 2023-11-08 ENCOUNTER — Encounter: Admit: 2023-11-08 | Discharge: 2023-11-08 | Payer: MEDICARE

## 2023-11-08 ENCOUNTER — Ambulatory Visit: Admit: 2023-11-08 | Discharge: 2023-11-09 | Payer: MEDICARE

## 2023-11-08 DIAGNOSIS — M5020 Other cervical disc displacement, unspecified cervical region: Secondary | ICD-10-CM

## 2023-11-08 DIAGNOSIS — Z981 Arthrodesis status: Secondary | ICD-10-CM

## 2023-11-08 DIAGNOSIS — R200 Anesthesia of skin: Secondary | ICD-10-CM

## 2023-11-08 DIAGNOSIS — G959 Disease of spinal cord, unspecified: Secondary | ICD-10-CM

## 2023-11-08 DIAGNOSIS — R252 Cramp and spasm: Secondary | ICD-10-CM

## 2023-11-08 DIAGNOSIS — M5030 Other cervical disc degeneration, unspecified cervical region: Secondary | ICD-10-CM

## 2023-11-08 DIAGNOSIS — M542 Cervicalgia: Secondary | ICD-10-CM

## 2023-11-08 DIAGNOSIS — R29898 Other symptoms and signs involving the musculoskeletal system: Secondary | ICD-10-CM

## 2023-11-08 DIAGNOSIS — M5412 Radiculopathy, cervical region: Secondary | ICD-10-CM

## 2023-11-08 NOTE — Progress Notes
 Darin A. Wilda Handsome, MD [facility]  Follow - Up Visit  Subjective     REASON FOR VISIT   Pain of the Neck, Pain of the Right Arm, and Numbness and Tingling of the Right Hand    SUBJECTIVE     Darin [name] returns today to review CT findings.  He has persistent right upper arm pain in the area of the triceps.  He has intermittent spasming in the arm.  He describes worsening neurologic symptoms in his legs as well as spasming.          ROS: Review of Systems   Musculoskeletal:  Positive for arthralgias, neck pain and neck stiffness.   Neurological:  Positive for numbness.   All other systems reviewed and are negative.    A 10-point ROS was performed and negative.    PHYSICAL EXAM   Blood pressure (!) 142/80, pulse 65, height 172.7 cm (5' 8), weight 91.6 kg (202 lb), SpO2 99%.  Body mass index is 30.71 kg/m?Darin [name]  Oswestry Total Score:: (Patient-Rptd) 60  Pain Score: Seven    Constitutional: Alert, NAD  Head: Atraumatic  Eyes: EOMI  Respiratory: Unlabored breathing  Cardiovascular: Regular rate  Skin: No rashes or open wounds appreciated on back  Musculoskeletal: Strength stable  Neurologic: Sensation stable    Unchanged.    RADIOGRAPHS     Outside CT scan cervical spine is available for review.  Shows implants from C3-4, C4-5, C5-6 are intact.  No evidence of loosening or failure.  There is bridging bone through the cages, behind the caging, and in front of the caging in some respects at all levels which indicate there is a solid fusion at all three of these levels.       ASSESSMENT / PLAN     Darin [name] is a 63 y.o. male with     1. History of fusion of cervical spine        2. Cervical radiculopathy        3. Right arm weakness        4. Numbness and tingling in right hand        5. Neck pain        6. DDD (degenerative disc disease), cervical        7. Cervical herniated disc        8. Spasticity        9. Cervical myelopathy (CMS-HCC)            We have reviewed his CT scan and then gone back to his MRI from 07/27/2023.    MRI from 07/27/2023 had metal artifact at the levels of his previous operation but I don't see anything there concerning.    At the level distal to his fusion, which is C6-7, he has severe foraminal stenosis on the right and he also has narrowing centrally with complete effacement of the CSF and cord compression.  With his complaints of right posterior arm pain in the area of the triceps, I think that this certainly could match up with this C7 nerve root.    CT scan that we are reviewing today from 10/01/2023 indeed confirms he has solid fusion at C3-4, 4-5, 5-6.  I see bridging bone at all of these levels.  Given his pain and symptoms, as well as his new or worsening lower extremity neurologic symptoms that could be from cord compression, I think it is reasonable to consider revising or extending his fusion distally.  Given his previous operations, I would perform an ACDF at C6-7 with cage placement, decompression of the right neural foramen, and C7 nerve root.  I would supplement this with a plate and screw construct this time as this is distal enough that I think it would not necessarily cause significant dysphagia.  We might reutilize his anterior incision that we used before; however, being that we are farther down there is another neck crease I might use that is a little lower.  Distally I might use a new native incision and I think this may decrease the risks of encountering previous scar tissue.  Recovery and convalescence would be similar to his previous surgery and I think if he is interested in pursuing this, we would be happy to find a mutually convenient date.         In the presence of Shirlee Dotter, MD , I have taken down these notes, Jerrol Morelle, Scribe. Nov 08, 2023 10:58 AM    Darin [name]. Pennelope Bowler, MD, MPH  Spinal Surgery  Veda Gerald. Wilda Handsome MD, [facility]  Nurse: Sallyanne Creamer, BSN, RN, CNOR   [phone]  -  [email or web address]

## 2023-11-09 ENCOUNTER — Encounter: Admit: 2023-11-09 | Discharge: 2023-11-09 | Payer: MEDICARE

## 2023-11-14 ENCOUNTER — Encounter: Admit: 2023-11-14 | Discharge: 2023-11-14 | Payer: MEDICARE

## 2023-11-14 MED ORDER — REPATHA SURECLICK 140 MG/ML SC PNIJ
140 mg | SUBCUTANEOUS | 0 refills | 28.00000 days | Status: AC
Start: 2023-11-14 — End: ?
  Filled 2023-12-03: qty 6, 84d supply, fill #1

## 2023-11-22 ENCOUNTER — Encounter: Admit: 2023-11-22 | Discharge: 2023-11-22 | Payer: MEDICARE

## 2023-11-27 ENCOUNTER — Encounter: Admit: 2023-11-27 | Discharge: 2023-11-27 | Payer: MEDICARE

## 2023-11-27 DIAGNOSIS — R972 Elevated prostate specific antigen [PSA]: Secondary | ICD-10-CM

## 2023-11-30 ENCOUNTER — Encounter: Admit: 2023-11-30 | Discharge: 2023-11-30 | Payer: MEDICARE

## 2023-12-03 ENCOUNTER — Encounter: Admit: 2023-12-03 | Discharge: 2023-12-03 | Payer: MEDICARE

## 2023-12-04 ENCOUNTER — Encounter: Admit: 2023-12-04 | Discharge: 2023-12-04 | Payer: MEDICARE

## 2023-12-04 NOTE — Patient Instructions
 SURGERY DATE:  (TENTATIVE)  01/14/24    (There is a chance your surgery may be rescheduled to a different date due to surgeon conflicts or emergencies.  You will be notified of any surgery date change)     PROCEDURE:  ANTERIOR CERVICAL DISCECTOMY AND FUSION CERVICAL 6-7, ANTERIOR PLATE PLACEMENT, INTERBODY CAGE PLACEMENT, POSSIBLE ILIAC CREST BONE GRAFT     LOCATION:    -[facility]: [address], [place], [place] [address]    ARRIVAL:    -You will check in at the information desk the day of your surgery.  The information desk is located in the lobby at the hospital entrance.     AFTER SURGERY:    -We will have you up and walking after of your surgery once you are fully awake. But please don't walk without assistance, even if it's just to use the bathroom. Please utilize your call light.    HYGIENE:   -You may shower after surgery.  Good hygiene is very important for preventing infections. Normal showering keeps your surrounding skin clean and healthy.  No bath tubs, hot tubs, or swimming pools.     DRESSING / INCISION CARE:    -Keep dressing on for 7 days, then remove.    -Do not use any lotions, ointments, or creams on incision sites.    ACTIVITY:    -No lifting over 5-10 pounds until your first post op visit (about 4 weeks after surgery).  You may walk as much as you like and use your arms for daily tasks.  If you have a collar on, please wear it until your first postop visit.     PAIN:   -You will experience pain or discomfort after surgery.  The pain you have prior to surgery may be gone but now you'll have surgical pain.  Take your pain medication as prescribed.  The pain medication will not make the pain totally go away.  It should help keep the pain under control and tolerable.     -Pain between the shoulder blades is common after an ACDF.  It is due the positioning in the operating room that aids in surgical site exposure.  You can ice the affected area.  The pain will subside but may take a couple of weeks or more.      -Your throat may be sore for a few weeks after surgery.  Eat soft foods and liquids.      -If you had pain radiating to shoulder or arm prior to surgery it may be gone after surgery.  The nerve pain may come back a few days to a week after surgery and may last a couple of weeks.  The nerve is regenerating after surgery.      PAIN MEDICATION:    -Opioid pain medications will be prescribed postoperatively. You will receive a prescription upon discharge from the hospital. Take your pain medication as prescribed.  DO NOT take more than what is prescribed unless instructed by our office.  Pain meds can be constipating.  A laxative may be prescribed along with the pain medication.  Take laxatives, add fiber to your diet, and drink plenty of fluids while on pain medication.     FMLA/ SHORT TERM DISABILITY:    -If FMLA or Short Term Disability will be used, please provide this paperwork ASAP.  Please fax any FMLA/Short Term Disability paperwork to  Zebedee Downing at [phone].  Please note that we require 72 business hours to complete  these forms and have them signed by Dr. Fernando.    -Returning to work varies from person to person depending on your job description, your surgery procedure, and recovery.  I would encourage you to take 4 weeks off.  We will get x-rays and evaluate you at your first postop appointment and determine if you're able to go back to work at that point.  Strenuous type work, you will likely not be ready to return for 8-12 weeks following surgery.  Light duty and sedentary type work can vary for returning to work between 4-12 weeks.      PHYSICAL THERAPY:    -If you need PT, this will likely be prescribed about 3 months after surgery.  We need to allow your surgery to properly heal.  We will assess this throughout your recovery.       DRIVING:    -No driving while taking pain medications.  Once you are no longer taking pain medication, you may resume driving if you feel safe to operate a vehicle during your recovery.      PLEASE NOTIFY OUR OFFICE IF:    You have difficulty swallowing liquids or have difficulty breathing.   You have a fever greater than 101 degrees or chills  You have redness, drainage, swelling, pus formation at incision site.  (We will ask you to send a picture preferably through MyChart or you can send it to my e-mail if there is redness or drainage at the incision site)  You have increased pain that is not effectively controlled with pain meds.  (Remember the medication will not make the pain totally go away)  You need a refill on your pain medication.  DO NOT wait until you are out of pain medication to call the office.  Prescriptions can't always be filled the same day you call.  Please call when you have a 2-3 day supply left.  Prescriptions will not be filled on weekends or holidays.             Olam Danas RN, BSN  Clinical Nurse Coordinator  Dr. Penne Fernando Kitchens A. [facility]  [address]  [place], [place] [address]  [phone]  [phone] Fax  [email or web address]

## 2023-12-20 ENCOUNTER — Encounter: Admit: 2023-12-20 | Discharge: 2023-12-20 | Payer: MEDICARE

## 2023-12-31 ENCOUNTER — Encounter: Admit: 2023-12-31 | Discharge: 2023-12-31 | Payer: MEDICARE

## 2024-01-01 ENCOUNTER — Encounter: Admit: 2024-01-01 | Discharge: 2024-01-01 | Payer: MEDICARE

## 2024-01-01 ENCOUNTER — Ambulatory Visit: Admit: 2024-01-01 | Discharge: 2024-01-01 | Payer: MEDICARE

## 2024-01-01 DIAGNOSIS — M542 Cervicalgia: Secondary | ICD-10-CM

## 2024-01-01 DIAGNOSIS — R200 Anesthesia of skin: Secondary | ICD-10-CM

## 2024-01-01 DIAGNOSIS — M5412 Radiculopathy, cervical region: Secondary | ICD-10-CM

## 2024-01-01 DIAGNOSIS — R29898 Other symptoms and signs involving the musculoskeletal system: Secondary | ICD-10-CM

## 2024-01-01 DIAGNOSIS — M5020 Other cervical disc displacement, unspecified cervical region: Secondary | ICD-10-CM

## 2024-01-01 DIAGNOSIS — G959 Disease of spinal cord, unspecified: Secondary | ICD-10-CM

## 2024-01-01 DIAGNOSIS — Z981 Arthrodesis status: Principal | ICD-10-CM

## 2024-01-01 DIAGNOSIS — Z419 Encounter for procedure for purposes other than remedying health state, unspecified: Principal | ICD-10-CM

## 2024-01-01 DIAGNOSIS — M5030 Other cervical disc degeneration, unspecified cervical region: Secondary | ICD-10-CM

## 2024-01-01 NOTE — Progress Notes
 [name], MD [facility]  PreOperative History and Physical      CHIEF COMPLAINT     Chief Complaint   Patient presents with    Neck - Pain    Lower Back - Pain    Middle Back - Pain    Right Arm - Pain, Numbness, Tingling       HISTORY OF PRESENT ILLNESS      Interval HPI:   Darin [name] is a 63 y.o. male.  Presents today for repeat evaluation.  He continues to have right arm pain and symptoms.  He presents for preoperative evaluation and for surgical consent    Previous HPI:     Darin [name] returns today to review CT findings. He has persistent right upper arm pain in the area of the triceps. He has intermittent spasming in the arm. He describes worsening neurologic symptoms in his legs as well as spasming.  Patient follows up for MRI via telephone.  He reports symptoms are unchanged.  Patient complains of right upper extremity pain as well as right sided neck pain.  He also reports some weakness and numbness.  He describes worsening right lower extremity symptoms that have worsened over the last few months.  History of ACDF by Dr. Fernando at C5-6 in 2021; previous ACDF at C 3-4 and 4-5.  He is wanting to know what his options are for treatment.  [name] presents today for evaluation. He is now 3 years 1 month s/p C5-6 ACDF on 05/14/2020. He reports he had severe pain at the base of his skull running down the right side of his neck into his shoulder. There is a spasm that runs up the side of his back more so on the right. The neck started over a year ago. He thought he would just deal with it for a while. The shoulder and arm started last spring/summer. He has some weakness of his right arm and hand, numbness in the hand and back of the upper arm. He reports starting earlier this week his arm shakes pretty intensely when he lifts his arm. Prior to surgery in 2021 he had the shakiness, including in the right leg, which he said is back to acting up now.     PAST MEDICAL HISTORY     Past Medical History:    Acid reflux    Adverse drug reaction    Back pain    Barrett's esophagus    Cancer of lung (CMS-HCC)    Coronary artery disease    Coronary atherosclerosis    Degenerative disc disease, cervical    Degenerative disc disease, thoracic    Difficult intubation    Dizziness    Dyspnea    Essential hypertension    GERD (gastroesophageal reflux disease)    History of heart artery stent    Hyperlipemia    Joint pain    Kidney disease    Lung disease    Nerve injury    Neuropathy    Other malignant neoplasm without specification of site    Spinal cord abscess    Spinal stenosis       PAST SURGICAL HISTORY     Surgical History:   Procedure Laterality Date    HX BACK SURGERY  1987    X2 for abscess after epidural injection    HX HEART CATHETERIZATION  2003    with stent placement x 1 on left side    BICEPS TENDON REPAIR Right 2005  SPINAL FUSION  2016    c3-c5 in [place]    ANTERIOR CERVICAL DISCECTOMY AND FUSION CERVICAL 5-6,  CAGE PLACEMENT CERVICAL 5-6 N/A 05/14/2020    Performed by Darin Penne NOVAK, MD at [facility] OR    ANTERIOR INSTRUMENTATION - 2 TO 3 VERTEBRAL SEGMENTS N/A 05/14/2020    Performed by Darin Penne NOVAK, MD at [facility] OR    ROBOT ASSISTED FLEXIBLE  BRONCHOSCOPY WITH IMAGE-GUIDED NAVIGATION- FLEXIBLE N/A 04/28/2022    Performed by Darin Drier, Maykol R, MD at [facility] OR    BRONCHOSCOPY DIAGNOSTIC WITH/ WITHOUT CELL WASHING - FLEXIBLE N/A 04/28/2022    Performed by Darin Drier, Maykol R, MD at [facility] OR    BRONCHOSCOPY WITH TRANSBRONCHIAL LUNG BIOPSY - FLEXIBLE - SINGLE LOBE N/A 04/28/2022    Performed by Darin Drier, Maykol R, MD at [facility] OR    BRONCHOSCOPY WITH ENDOBRONCHIAL ULTRASOUND GUIDED TRANSTRACHEAL/ TRANSBRONCHIAL SAMPLING - 3 OR MORE MEDIASTINAL/ HILAR LYMPH NODE STATIONS/ STRUCTURE - FLEXIBLE N/A 04/28/2022    Performed by Darin Drier, Maykol R, MD at [facility] OR    Robotic assisted video assisted thoracoscopy with RIGHT MIDDLE LOBE lobectomy, bronchoscopy Right 06/09/2022    Performed by Darin Dietrich RAMAN, MD at [facility] CVOR    Right robotic asissted THORACOSCOPY WITH MEDIASTINAL AND REGIONAL LYMPHADENECTOMY Right 06/09/2022    Performed by Darin Dietrich RAMAN, MD at [facility] CVOR    BRONCHOSCOPY DIAGNOSTIC - FLEXIBLE N/A 06/09/2022    Performed by Darin Dietrich RAMAN, MD at [facility] CVOR    ANGIOGRAPHY CORONARY ARTERY WITH LEFT HEART CATHETERIZATION N/A 11/03/2022    Performed by Darin Curtistine PARAS, MD at [facility] CATH LAB    PERCUTANEOUS CORONARY STENT PLACEMENT WITH ANGIOPLASTY N/A 11/03/2022    Performed by Darin Curtistine PARAS, MD at [facility] CATH LAB    BACLOFEN PUMP IMPLANTATION      since removed    CARDIAC CATHERIZATION  04/18/2002    CARDIOVASCULAR STRESS TEST  05/16/2022    COLONOSCOPY      CORONARY ANGIOPLASTY  04/18/2002    ECHOCARDIOGRAM PROCEDURE  05/11/2022    HX CORONARY STENT PLACEMENT  04/18/2002    HX TONSILLECTOMY  Unknown    LAMINECTOMY  12/27/1985    UMBILICAL ARTERIAL CATH - BEDSIDE  04/18/2002       FAMILY HISTORY   family history includes Alzheimer's in [name]'s mother; Cancer in [name]'s brother and father; Cancer-Colon in [name]'s paternal grandmother; Coronary Artery Disease in [name]'s father; Diabetes in [name]'s brother; Heart Attack in [name]'s brother; Heart Disease in [name]'s brother, brother, and paternal grandfather; Heart problem in [name]'s brother and brother; None Reported in [name]'s maternal grandfather, maternal grandmother, and son; Premature Heart Disease in [name]'s brother.    SOCIAL HISTORY     Social History     Socioeconomic History    Marital status: Married   Tobacco Use    Smoking status: Never    Smokeless tobacco: Never    Tobacco comments:     nO HX   Vaping Use    Vaping status: Never Used   Substance and Sexual Activity    Alcohol use: Not Currently     Comment: maybe once a month    Drug use: Never     Comment: No HX    Sexual activity: Yes     Partners: Female     Birth control/protection: None       ALLERGIES     Allergies   Allergen Reactions    Clonazepam  RASH Morphine VOMITING  Patient tolerates Hydrocodone -APAP 5/325 with best success. Most other pain medications cause vomiting       MEDICATIONS     Current Outpatient Medications:     aspirin  EC (ASPIR-LOW) 81 mg tablet, Take one tablet by mouth daily. (Patient not taking: Reported on 01/01/2024), Disp: , Rfl:     carisoprodoL (SOMA) 350 mg tablet, Take one tablet by mouth three times daily., Disp: , Rfl:     esomeprazole DR(+) (NEXIUM) 20 mg capsule, Take one capsule by mouth every morning. Take on an empty stomach at least 1 hour before or 2 hours after food., Disp: , Rfl:     evolocumab  (REPATHA  SURECLICK) 140 mg/mL injectable PEN, Inject 1 mL under the skin every 14 days., Disp: 6 mL, Rfl: 0    gabapentin  (NEURONTIN ) 300 mg capsule, Take three capsules by mouth three times daily., Disp: 810 capsule, Rfl: 1    HYDROcodone /acetaminophen  (NORCO) 5/325 mg tablet, Take one tablet to two tablets by mouth every 4 hours as needed. Do not start before June 29, 2022., Disp: 50 tablet, Rfl: 0    mv-min/folic/K1/lycopen/lutein (CENTRUM SILVER MEN PO), Take 1 tablet by mouth daily., Disp: , Rfl:     tamsulosin  (FLOMAX ) 0.4 mg capsule, Take two capsules by mouth daily. Take 30 min after same meal.  Do not cut/ crush/ chew. (Patient taking differently: Take two capsules by mouth at bedtime daily. Take 30 min after same meal.  Do not cut/ crush/ chew.), Disp: 180 capsule, Rfl: 3    REVIEW OF SYSTEMS   Review of Systems   Musculoskeletal:  Positive for arthralgias, back pain, gait problem, neck pain and neck stiffness.   All other systems reviewed and are negative.    A 10-point ROS was otherwise negative.  PHYSICAL EXAM   Blood pressure 137/83, pulse 67, height 172.7 cm (5' 8), weight 94.6 kg (208 lb 9.6 oz), SpO2 98%.  Body mass index is 31.72 kg/m?SABRA    Oswestry Total Score:: (Patient-Rptd) 64  Pain Score: Seven    Constitutional: Alert, NAD  Psychiatric: Mood and affect appropriate  Eyes: EOMI  Respiratory: Unlabored respirations  Cardiovascular: Palpable radial and pedal pulses distally.  Skin: No rashes or lesions  Musculoskeletal:  Spine Exam:    Gait: Ambulates with a normal gait. Able to heel and toe walk without difficulty.   Stance: Balanced in the coronal and sagittal planes.     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    We have reviewed his CT scan and then gone back to his MRI from 07/27/2023.     MRI from 07/27/2023 had metal artifact at the levels of his previous operation but I don't see anything there concerning.  At the level distal to his fusion, which is C6-7, he has severe foraminal stenosis on the right and he also has narrowing centrally with complete effacement of the CSF and cord compression.  With his complaints of right posterior arm pain in the area of the triceps, I think that this certainly could match up with this C7 nerve root.     CT scan that we are reviewing today from 10/01/2023 indeed confirms he has solid fusion at C3-4, 4-5, 5-6.  I see bridging bone at all of these levels.  Given his pain and symptoms, as well as his new or worsening lower extremity neurologic symptoms that could be from cord compression, I think it is reasonable to consider revising or extending his fusion distally.     Given his previous operations, I would perform an ACDF at C6-7 with cage placement, decompression of the right neural foramen, and right C7 nerve root.  I would supplement this with a plate and screw construct this time as this is distal enough that I think it would not necessarily cause significant dysphagia.  We might reutilize his anterior incision that we used before; however, being that we are farther down there is another neck crease I might use that is a little lower.  Distally I might use a new native incision and I think this may decrease the risks of encountering previous scar tissue.  Recovery and convalescence would be similar to his previous surgery and I think if he is interested in pursuing this, we would be happy to find a mutually convenient date.    With regards to the question of whether or not to use a metal titanium interbody cage versus structural allograft.  This question was posed by his insurance company during the authorization process.    To address this question or concern, I would say that in my experience of my practice over the last 7 years, the only pseudoarthroses I encountered have been with structural allograft.  I believe that this is largely because of it is inert bone and has a higher likelihood that the body may or may not incorporate it versus during the bone formation and incorporation process, an allograft product can be degraded during the osteoclastic reaction.  In all of the scenarios where I encountered a pseudoarthrosis, revising it to a 3D printed roughened titanium cage that has a larger footprint and a larger graft window allows me to put more bone inside the graft window while maintaining a structural graft that the body can incorporate into (the 3D roughened surface).  Despite the comment by the insurance denial letter, there excellent evidence supporting 3D printed Ruffin titanium cages and that they have high fusion potential.  The other reasons I would say that using a titanium cage provides advantages in this type of the surgery for me would be that this gives me more options for sizing in terms of both height as well as and more importantly anterior posterior and medial lateral.  With the largest footprint implant, I believe that I can place the cage onto the apophyseal rings out laterally while not compromising the endplates.  This gives increase structural stability to the implant itself and decreases the rates of subsidence.  This allows for a large graft window where we placed local autograft, allograft and/or iliac crest bone graft if necessary.  With a 1 level operation I would anticipate plenty of local autograft and possibly supplement with allograft.    Please let the above  statement serves as my letter of medical necessity for any sort of appeal related to the cage code [id number].  I have discussed this in depth with the patient.  It should also be noted that the patient has had 3 previous fusions in his neck and all of which have synthetic cages and none of which are allograft.  The patient is interested in another titanium interbody cage based on the logic above.      We discussed the risks, potential benefits and alternatives to surgical intervention. Specifically we discussed risk of wound complications, draining wounds, infection, bleeding potentially requiring transfusion, dural tear, nerve injury, weakness, numbness, fracture, deformity, and possible future surgery.      Surgical procedure:  ANTERIOR CERVICAL DISCECTOMY AND FUSION CERVICAL 6-7, ANTERIOR PLATE PLACEMENT, INTERBODY CAGE PLACEMENT, POSSIBLE ILIAC CREST BONE GRAFT       RAPT SCORE  What is your age group?: 69 - 110 yr. old  What is your gender?: Male  How far, on average, can patient walk? : [place]  Which gait aid do you use?: Single-point stick  Do you use community supports?: None or 1 per week  Will you live with someone who can care for you after your operation?: Yes  RAPT Score: 10      Short-Acting Medications  Hydrocodone  IR Dosage (mg): 5  # of Hydrocodone  IR tablets in 24 hours: 5         MME Total Score  MME Score: 25 Marybella Ethier B. Fernando, MD, MPH  Spinal Surgery  [facility]. Veria, MD [facility]  Nurse: Olam Danas, BSN, RN, CNOR   [phone]  -  [email or web address]

## 2024-01-02 ENCOUNTER — Encounter: Admit: 2024-01-02 | Discharge: 2024-01-02 | Payer: MEDICARE

## 2024-01-14 ENCOUNTER — Encounter: Admit: 2024-01-14 | Discharge: 2024-01-14 | Payer: MEDICARE

## 2024-02-06 ENCOUNTER — Encounter: Admit: 2024-02-06 | Discharge: 2024-02-06 | Payer: MEDICARE

## 2024-02-06 DIAGNOSIS — E7800 Pure hypercholesterolemia, unspecified: Principal | ICD-10-CM

## 2024-02-06 DIAGNOSIS — I2510 Atherosclerotic heart disease of native coronary artery without angina pectoris: Secondary | ICD-10-CM

## 2024-02-06 LAB — LIPID PROFILE: CHOLESTEROL/HDL %: 3

## 2024-02-15 ENCOUNTER — Encounter: Admit: 2024-02-15 | Discharge: 2024-02-15 | Payer: MEDICARE

## 2024-02-19 ENCOUNTER — Ambulatory Visit: Admit: 2024-02-19 | Discharge: 2024-02-20 | Payer: MEDICARE

## 2024-02-19 ENCOUNTER — Encounter: Admit: 2024-02-19 | Discharge: 2024-02-19 | Payer: MEDICARE

## 2024-02-19 DIAGNOSIS — R0989 Other specified symptoms and signs involving the circulatory and respiratory systems: Secondary | ICD-10-CM

## 2024-02-19 DIAGNOSIS — I728 Aneurysm of other specified arteries: Secondary | ICD-10-CM

## 2024-02-19 DIAGNOSIS — E7800 Pure hypercholesterolemia, unspecified: Secondary | ICD-10-CM

## 2024-02-19 DIAGNOSIS — I2510 Atherosclerotic heart disease of native coronary artery without angina pectoris: Principal | ICD-10-CM

## 2024-02-19 DIAGNOSIS — I10 Essential (primary) hypertension: Secondary | ICD-10-CM

## 2024-02-19 MED ORDER — REPATHA SURECLICK 140 MG/ML SC PNIJ
140 mg | SUBCUTANEOUS | 3 refills | 28.00000 days | Status: AC
Start: 2024-02-19 — End: ?
  Filled 2024-02-21: qty 6, 84d supply, fill #1

## 2024-02-19 NOTE — Patient Instructions
 Follow-Up:    -Thank you for allowing us  to participate in your care today. Your After Visit Summary is being completed by Wyvonna, RN.    -We would like you to follow up in 1 years with Curtistine Fallow, MD  -The schedule is released approximately 4-5 months in advance. You will be called by our scheduling department to make an appointment and you will also receive a notification via MyChart to self-schedule.  However, if you would like to call to make this appointment, please call [phone].    Changes From Today's Office Visit  A refill for your Repatha  was sent to [facility].     Contacting our office:    -Business Hours: Monday-Friday, 8:00 am-4:30 pm (excluding Holidays).     -For medical questions or concerns, please send us  a message through your MyChart account or call the [facility] team nursing triage line at [phone]. Please leave a detailed message with your name, date of birth, and reason for your call.  If your message is received before 3:30pm, every effort will be made to call you back the same day.  Please allow time for us  to review your chart prior to call back.     -For medication refills please start by contacting your pharmacy. You can also send us  a prescription question through your MyChart or call the nurse triage line above.     -Please allow a minimum of 10-14 business days for clearance from your cardiologist for procedures and/or FMLA, disability, or  DOT forms. An office visit or testing may be required for us  to provide clearance.     [facility] nursing team fax number: [phone]    -You may receive a survey in the upcoming weeks from [facility]. Your feedback is important to us  and helps us  continue to improve patient care and patient satisfaction.     -Please feel free to call our Financial Department at [phone] with any questions or concerns about estimated cost of testing or imaging ordered today. We are happy to provide CPT codes upon request.    Results & Testing Follow Up:    -Please allow 5-7 business days for the results of any testing to be reviewed. Please call our office if you have not heard from a nurse within this time frame.    -Should you choose to complete testing at an outside facility, please contact our office after completion of testing so that we can ensure that we have received results for your provider to review.    Lab and test results:  As a part of the CARES act, starting 10/02/2019, some results will be released to you via MyChart immediately and automatically.  You may see results before your provider sees them; however, your provider will review all these results and then they, or one of their team, will notify you of result information and recommendations.   Critical results will be addressed immediately, but otherwise, please allow us  time to get back with you prior to you reaching out to us  for questions.  This will usually take about 72 hours for labs and 5-7 days for procedure test results.

## 2024-02-19 NOTE — Progress Notes
 Date of Service: 02/19/2024    Darin [name] is a 63 y.o. male.       HPI    The patient has some exertional dyspnea especially with stairs.  He is now 63 years old.  He has had no chest discomfort.  He cannot walk very well due to significant spinal injury in the past.  He needs upcoming cervical surgery which is planned to be a C6/C7 fusion.  His cervical surgery was recently canceled until he was cleared by us  for surgery.  He has had no palpitations or syncope.  He has had no edema.  He has no chest discomfort with activity.  He says he does not sleep well.  His cholesterol was 134 February 06, 2024 with an LDL of 36 and HDL of 53.  His BMI today was 31.7 and his blood pressure today was 129/83.  He shortness of breath is better in general.  Compared to last year his symptoms have overall improved since his PCI and his exam is unchanged.       Darin [name] is a 63 year old male with coronary artery disease who presents for pre-surgical cardiac evaluation.    He experiences shortness of breath, particularly during activities such as walking or climbing stairs. This symptom has remained stable over time. He does not experience heart throbbing. No chest pain, heaviness, tightness, or pressure. His breathing has improved compared to a year ago following a heart catheterization and stent placement in May 2024.    He has a history of a spinal cord injury, which significantly limits his ability to walk and perform physical activities. Surgery to address a rib to disc issue in the C6, C7 area was postponed pending cardiac evaluation. He is under the care of an orthopedic surgeon, Dr. Fernando, for this condition.    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 No significant change in breathing since the lung surgery.    He is currently on Repatha  for cholesterol management, with his last blood work showing excellent results: total cholesterol of 134, LDL of 36, and HDL of 53. He also takes a baby aspirin  due to a history of stent placement. He reports stable weight but struggles with weight loss due to limited physical activity. He experiences sleep difficulties, averaging four to five hours of sleep per night.    In the review of symptoms, no heart pounding, racing, passing out, ankle swelling, bleeding, or significant allergies. Stable hearing and vision, and no lightheadedness or dizziness, though he experiences balance issues due to his spinal cord injury. He has a family history of significant cardiovascular disease, with his father having had bypass surgery at a young age and severe carotid artery blockage before his death.              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    Difficult airway 06/09/2022    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  No other significant epicardial or coronary lesions identified.  08/22/2012 Heart Cath: Mild instent restenosis in LAD with negative FFR measurement of 0.92.  No other significant lesions seen in LAD or diag branches.  Mild atherosclerosis RCA no significant lesions.  Normal LV contractility and EF.  07/23/2014 Cardiolite stress test: Normal myocardial perfusion study.  No evidence for significant ischemia or scar noted.  Normal LV function.      Hyperlipemia 09/14/2017    Essential hypertension 09/14/2017       Social History     Tobacco Use    Smoking status: Never    Smokeless tobacco: Never    Tobacco comments:     nO HX   Vaping Use    Vaping status: Never Used   Substance and Sexual Activity    Alcohol use: Not Currently     Comment: maybe once a month    Drug use: Never     Comment: No HX    Sexual activity: Yes     Partners: Female     Birth control/protection: None        Family History   Problem Relation Name Age of Onset    Alzheimer's Mother      Coronary Artery Disease Father Father         2 bypass surgeries, clog coratid artery I    Cancer Father Father         Pancreatic cancer    Heart Disease Brother Richard     Diabetes Brother Richard     Heart problem Brother Richard         Triple bypass    Premature Heart Disease Brother Richard         Bypass surgery 2021    None Reported Son      None Reported Maternal Grandmother      None Reported Maternal Grandfather      Cancer-Colon Paternal Grandmother      Heart Disease Paternal Grandfather      Heart Disease Brother Ubaldo     Heart Attack Brother Ubaldo     Heart problem Brother Ubaldo     Cancer Brother Ubaldo         Review of Systems   Constitutional: Negative. Negative for decreased appetite, malaise/fatigue, weight gain and weight loss.   HENT: Negative.  Negative for hearing loss and nosebleeds.    Eyes: Negative.  Negative for vision loss in left eye and vision loss in right eye.   Cardiovascular:  Positive for dyspnea on exertion. Negative for chest pain, leg swelling, palpitations and syncope.  Respiratory: Negative.  Negative for cough and shortness of breath.    Endocrine: Negative.  Negative for cold intolerance and heat intolerance.   Hematologic/Lymphatic: Negative.  Negative for adenopathy and bleeding problem. Does not bruise/bleed easily.   Skin: Negative.  Negative for color change, rash and suspicious lesions.   Musculoskeletal: Negative.  Negative for joint swelling and muscle weakness.   Gastrointestinal: Negative.  Negative for hematochezia and melena.   Genitourinary: Negative.  Negative for hematuria.   Neurological:  Positive for loss of balance. Negative for dizziness, light-headedness and vertigo.   Psychiatric/Behavioral: Negative.  Negative for altered mental status and memory loss.    Allergic/Immunologic: Negative.  Negative for environmental allergies.       Physical Exam   Nursing note and vitals reviewed.  Constitutional: [name] appears well-developed.  Non-toxic appearance. No distress.   HENT:   Head: Normocephalic and atraumatic.   Nose: Nose normal. Mouth/Throat: Mucous membranes are moist.   Eyes: Right eye exhibits no discharge. Left eye exhibits no discharge. No scleral icterus.   Neck: No JVD present. Carotid bruit is not present. No thyromegaly present.   Cardiovascular: Normal rate and regular rhythm. Exam reveals no gallop and no S3.   No murmur heard.  Pulmonary/Chest: Breath sounds normal. No stridor. No tachypnea. No respiratory distress. [name] has no wheezes. Darin [name] has no rales. Darin [name] exhibits no tenderness.   Abdominal: Soft. Normal appearance. Darin [name] exhibits no distension and no mass. There is no splenomegaly or hepatomegaly. There is no abdominal tenderness. There is no guarding.   Musculoskeletal:         General: No swelling or tenderness.      Cervical back: Neck supple. No rigidity or tenderness.      Right lower leg: No edema.      Left lower leg: No edema.   Lymphadenopathy: [name] has no cervical adenopathy.   Neurological: [name] is alert and oriented to person, place, and time. [name] is not disoriented. No cranial nerve deficit.   Skin: Skin is warm and dry. No bruising and no rash noted. [name] is not diaphoretic. No erythema. No jaundice.   Psychiatric: Darin [name]'s behavior is normal. Mood, judgment and thought content normal. Darin [name]'s mood appears not anxious. Darin [name]'s affect is not inappropriate.       Cardiovascular Studies  Most recent results for 12-Lead ECG   ECG 12-LEAD    Collection Time: 02/19/24  1:20 PM   Result Value Status    VENTRICULAR RATE 67 Final    P-R INTERVAL 188 Final    QRS DURATION 80 Final    Q-T INTERVAL 372 Final    QTC CALCULATION (BAZETT) 393 Final    P AXIS 46 Final    R AXIS 45 Final    T AXIS 56 Final    Impression    Normal sinus rhythm  Normal ECG  When compared with ECG of 03-Nov-2022 13:18,  No significant change was found  Confirmed by Emilia Faden ([id number]) on 02/19/2024 1:22:43 PM     *Note: Due to a large number of results and/or encounters for the requested time period, some results have not been displayed. A complete set of results can be found in Results Review.     05/09/22   2D + DOPPLER ECHO   Result Value Ref Range    BSA 2.17 m2    Interp Only Sonographer [facility] [place] [place]     LA size 3.5 3.0 - 4.0 cm    LA volume  51 18 - 58 mL    Left Atrium Index 23.50 16 - 34 mL/m2    Right Ventricular Basal Diameter 3.5 2.5 - 4.1 cm    Right Atrial Area 12.3 <18 cm2    Right Ventricular Mid Diameter 2.8 1.9 - 3.5 cm    Right Heart Systolic Mmode TAPSE 1.8 >1.7 cm    Sinus 3.8 2.8 - 4.0 cm    Ascending aorta 3.2 cm    LVOT diameter 2.0 cm    LVOT area 3.14 cm2    LVOT peak vel 0.8 m/s    LVOT peak VTI 16.5 cm    LVOT stroke volume 51.84 cm3    AV peak velocity 1.1 m/s    Ao VTI 23.0 cm    Aortic valve area = 2.25 cm2    , with a mean gradient of 2 mmHg    and a peak gradient of 5 mmHg    AV index (native) 0.73     E/A ratio 1.43     TDI Medial e' 0.060 m/s    Medial E/E' ratio 11.00     TDI lateral e' 0.080 m/s    Lateral E/E' ratio 8.25     E wave decelartion time 211.0 msec    MV Peak E Vel PW 0.660 m/s    MV Peak A Vel 0.460 m/s    Right Heart Systolic TDI S' 0.1 m/s    AV Stroke Volume Index 24     ECHO EF 60 %    LVIDD 4.2 4.2 - 5.8 cm    LVIDS 2.3 2.5 - 4.0 cm    IVS 1.2 0.6 - 1.0 cm    PW 1.3 0.6 - 1.0 cm    FS 45.24 28 - 44 %    Teichholtz 76.90 %    LV mass 189 88 - 224 g    Left Ventricle Mass Index 87 49 - 115 g/m2    RWT 0.62 <=0.42    Left Ventricle Systolic Volume 24 21 - 61 mL    Left Ventricle Systolic Volume Index 11 11 - 31 mL/m2    Left Ventricle Diastolic Volume 68 62 - 150 mL    Left Ventricle Diastolic Volume Index 31 34 - 74 mL/m2         Cardiovascular Health Factors  Vitals BP Readings from Last 3 Encounters:   02/19/24 129/83   01/01/24 137/83   11/08/23 (!) 142/80     Wt Readings from Last 3 Encounters:   02/19/24 94.8 kg (209 lb)   01/01/24 94.6 kg (208 lb 9.6 oz)   11/08/23 91.6 kg (202 lb)     BMI Readings from Last 3 Encounters:   02/19/24 31.78 kg/m?   01/01/24 31.72 kg/m?   11/08/23 30.71 kg/m?      Smoking Tobacco Use History[1]   Lipid Profile Cholesterol   Date Value Ref Range Status   02/06/2024 134  Final     HDL   Date Value Ref Range Status   02/06/2024 53  Final     LDL   Date Value Ref Range Status   02/06/2024 36  Final     Triglycerides   Date Value Ref Range Status   02/06/2024 225 (H) <=150 Final      Blood Sugar Hemoglobin A1C   Date Value Ref Range Status   01/01/2024 5.5 4.0 - 5.7 % Final     Comment:     The ADA recommends that most  patients with type 1 and type 2 diabetes maintain an A1c level <7%.     Glucose   Date Value Ref Range Status   01/01/2024 148 (H) 70 - 100 mg/dL Final   90/75/7975 895  Final   10/24/2022 116  Final     Glucose, POC   Date Value Ref Range Status   06/09/2022 159 (H) 70 - 100 MG/DL Final   95/72/7978 94 70 - 100 MG/DL Final      Lab Results  Lab Results   Component Value Date    HGB 15.5 01/01/2024    HCT 44.5 01/01/2024    PLTCT 213 01/01/2024      Lab Results   Component Value Date    K 4.0 01/01/2024    CR 1.18 01/01/2024      Results for orders placed or performed during the hospital encounter of 06/22/22   CHEST 2 VIEWS    Narrative    CHEST 2 VIEWS     INDICATION: Neuroendocrine tumor, s/p resection    COMPARISON: 06/11/2022      Impression    Stable postsurgical changes of right middle lobectomy with mild volume loss. No consolidation, pleural effusion or pneumothorax.    Heart is normal in size.       Finalized by Kaitlin Marquis, M.D. on 06/22/2022 4:41 PM. Dictated by Erlinda Sartorius, M.D. on 06/22/2022 4:38 PM.          Problems Addressed Today  Encounter Diagnoses   Name Primary?    Coronary artery disease involving native heart, unspecified vessel or lesion type, unspecified whether angina present Yes    Cardiovascular symptoms     Pure hypercholesterolemia     Essential hypertension     Aneurysm of splenic artery        Current Medications (including today's revisions)   aspirin  EC (ASPIR-LOW) 81 mg tablet Take one tablet by mouth daily. (Patient not taking: Reported on 02/19/2024)    carisoprodoL (SOMA) 350 mg tablet Take one tablet by mouth three times daily.    esomeprazole DR(+) (NEXIUM) 20 mg capsule Take one capsule by mouth every morning. Take on an empty stomach at least 1 hour before or 2 hours after food.    evolocumab  (REPATHA  SURECLICK) 140 mg/mL injectable PEN Inject 1 mL under the skin every 14 days.    gabapentin  (NEURONTIN ) 300 mg capsule Take three capsules by mouth three times daily.    HYDROcodone /acetaminophen  (NORCO) 5/325 mg tablet Take one tablet to two tablets by mouth every 4 hours as needed. Do not start before June 29, 2022.    mv-min/folic/K1/lycopen/lutein (CENTRUM SILVER MEN PO) Take 1 tablet by mouth daily.    tamsulosin  (FLOMAX ) 0.4 mg capsule Take two capsules by mouth daily. Take 30 min after same meal.  Do not cut/ crush/ chew. (Patient taking differently: Take two capsules by mouth at bedtime daily. Take 30 min after same meal.  Do not cut/ crush/ chew.)       Assessment and Plan        Coronary artery disease, status post stent placement  Coronary artery disease with stent placement. Asymptomatic, stable EKG, no ischemic changes. No stress testing needed pre-surgery due to recent catheterization.  - Continue baby aspirin , hold for surgery.  - Schedule annual follow-up unless symptoms develop.    Hyperlipidemia, on PCSK9 inhibitor therapy  Hyperlipidemia controlled with PCSK9 inhibitor. Labs: total cholesterol 134, LDL 36, HDL 53.  - Refill Repatha  prescription.    Chronic  shortness of breath  Chronic dyspnea likely due to previous lobectomy and spinal cord injury. No new cardiac symptoms.    Spinal cord injury with planned C6-C7 fixation  Spinal cord injury with planned C6-C7 fixation. Cardiac clearance obtained, stable cardiac status.  - Schedule C6-C7 fixation surgery with orthopedic surgeon.            Impression  1.  Ischemic heart disease with remote coronary stenting in 2003 at the age of 33.  The patient subsequently underwent placement of an Onyx stent in his proximal LAD in May 2024.  2.  Structurally normal heart by echo in November 2023  3.  Hypertension  4.  Hypercholesterolemia with intolerance to statins.  He is on Repatha   5.  History of cervical myelopathy with previous cervical surgeries for degenerative changes and previous abscess  6.  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  He is doing better symptomatically with regards to his cardiac status and he is free of angina.  He should be able to go ahead with the planned cervical orthopedic surgery with the understanding that his cardiac risk is increased but not prohibitive.  He will try to stay on low-dose aspirin  daily although it may need to be interrupted for his neck surgery briefly.  His EKG looks stable.  We will see him back annually.  Will refill his Repatha .  His recent labs were reviewed with him including his lipid profile.  He will call if other issues arise in the interim.    Curtistine Fallow MD                  [1]   Social History  Tobacco Use   Smoking Status Never   Smokeless Tobacco Never   Tobacco Comments    nO HX

## 2024-02-20 ENCOUNTER — Encounter: Admit: 2024-02-20 | Discharge: 2024-02-20 | Payer: MEDICARE

## 2024-02-21 ENCOUNTER — Encounter: Admit: 2024-02-21 | Discharge: 2024-02-21 | Payer: MEDICARE

## 2024-02-22 ENCOUNTER — Encounter: Admit: 2024-02-22 | Discharge: 2024-02-22 | Payer: MEDICARE

## 2024-02-22 NOTE — Telephone Encounter
 Notified pt surgery will scheduled 9/10 with Dr. Fernando.  Preop appt scheduled 8/28

## 2024-02-22 NOTE — Patient Instructions
 SURGERY DATE:  (TENTATIVE)  03/12/24    (There is a chance your surgery may be rescheduled to a different date due to surgeon conflicts or emergencies.  You will be notified of any surgery date change)     PROCEDURE:  ANTERIOR CERVICAL DISCECTOMY AND FUSION CERVICAL 6-7, ANTERIOR PLATE PLACEMENT, INTERBODY CAGE PLACEMENT, POSSIBLE ILIAC CREST BONE GRAFT     LOCATION:    [facility]:  [address] [place]  [place], [place] [address]    ARRIVAL:    -You will check in at the information desk the day of your surgery.  The information desk is located in the lobby at the hospital entrance.     AFTER SURGERY:    -We will have you up and walking after of your surgery once you are fully awake. But please don't walk without assistance, even if it's just to use the bathroom. Please utilize your call light.    HYGIENE:   -You may shower after surgery.  Good hygiene is very important for preventing infections. Normal showering keeps your surrounding skin clean and healthy.  No bath tubs, hot tubs, or swimming pools.     DRESSING / INCISION CARE:    -Keep dressing on for 7 days, then remove.    -Do not use any lotions, ointments, or creams on incision sites.    ACTIVITY:    -No lifting over 5-10 pounds until your first post op visit (about 4 weeks after surgery).  You may walk as much as you like and use your arms for daily tasks.  If you have a collar on, please wear it until your first postop visit.     PAIN:   -You will experience pain or discomfort after surgery.  The pain you have prior to surgery may be gone but now you'll have surgical pain.  Take your pain medication as prescribed.  The pain medication will not make the pain totally go away.  It should help keep the pain under control and tolerable.     -Pain between the shoulder blades is common after an ACDF.  It is due the positioning in the operating room that aids in surgical site exposure.  You can ice the affected area.  The pain will subside but may take a couple of weeks or more.      -Your throat may be sore for a few weeks after surgery.  Eat soft foods and liquids.      -If you had pain radiating to shoulder or arm prior to surgery it may be gone after surgery.  The nerve pain may come back a few days to a week after surgery and may last a couple of weeks.  The nerve is regenerating after surgery.      PAIN MEDICATION:    -Opioid pain medications will be prescribed postoperatively. You will receive a prescription upon discharge from the hospital. Take your pain medication as prescribed.  DO NOT take more than what is prescribed unless instructed by our office.  Pain meds can be constipating.  A laxative may be prescribed along with the pain medication.  Take laxatives, add fiber to your diet, and drink plenty of fluids while on pain medication.     FMLA/ SHORT TERM DISABILITY:    -If FMLA or Short Term Disability will be used, please provide this paperwork ASAP.  Please fax any FMLA/Short Term Disability paperwork to  [employer]eb[employer] Downing at [phone].  Please note that we require 72 business hours to complete  these forms and have them signed by Dr. Fernando.    -Returning to work varies from person to person depending on your job description, your surgery procedure, and recovery.  I would encourage you to take 4 weeks off.  We will get x-rays and evaluate you at your first postop appointment and determine if you're able to go back to work at that point.  Strenuous type work, you will likely not be ready to return for 8-12 weeks following surgery.  Light duty and sedentary type work can vary for returning to work between 4-12 weeks.      PHYSICAL THERAPY:    -If you need PT, this will likely be prescribed about 3 months after surgery.  We need to allow your surgery to properly heal.  We will assess this throughout your recovery.       DRIVING:    -No driving while taking pain medications.  Once you are no longer taking pain medication, you may resume driving if you feel safe to operate a vehicle during your recovery.      PLEASE NOTIFY OUR OFFICE IF:    You have difficulty swallowing liquids or have difficulty breathing.   You have a fever greater than 101 degrees or chills  You have redness, drainage, swelling, pus formation at incision site.  (We will ask you to send a picture preferably through MyChart or you can send it to my e-mail if there is redness or drainage at the incision site)  You have increased pain that is not effectively controlled with pain meds.  (Remember the medication will not make the pain totally go away)  You need a refill on your pain medication.  DO NOT wait until you are out of pain medication to call the office.  Prescriptions can't always be filled the same day you call.  Please call when you have a 2-3 day supply left.  Prescriptions will not be filled on weekends or holidays.             Olam Danas RN, BSN  Clinical Nurse Coordinator  Dr. Penne Fernando Kitchens A. [facility]  [address]  [place], [place] [address]  [phone]  [phone] Fax  [email or web address]

## 2024-02-25 ENCOUNTER — Encounter: Admit: 2024-02-25 | Discharge: 2024-02-25 | Payer: MEDICARE

## 2024-02-26 ENCOUNTER — Encounter: Admit: 2024-02-26 | Discharge: 2024-02-26 | Payer: MEDICARE

## 2024-02-28 ENCOUNTER — Encounter: Admit: 2024-02-28 | Discharge: 2024-02-28 | Payer: MEDICARE

## 2024-02-28 ENCOUNTER — Ambulatory Visit: Admit: 2024-02-28 | Discharge: 2024-02-29 | Payer: MEDICARE

## 2024-02-28 DIAGNOSIS — M5030 Other cervical disc degeneration, unspecified cervical region: Secondary | ICD-10-CM

## 2024-02-28 DIAGNOSIS — R200 Anesthesia of skin: Secondary | ICD-10-CM

## 2024-02-28 DIAGNOSIS — G959 Disease of spinal cord, unspecified: Secondary | ICD-10-CM

## 2024-02-28 DIAGNOSIS — M542 Cervicalgia: Secondary | ICD-10-CM

## 2024-02-28 DIAGNOSIS — Z981 Arthrodesis status: Principal | ICD-10-CM

## 2024-02-28 DIAGNOSIS — M5412 Radiculopathy, cervical region: Secondary | ICD-10-CM

## 2024-02-28 DIAGNOSIS — R29898 Other symptoms and signs involving the musculoskeletal system: Secondary | ICD-10-CM

## 2024-02-28 DIAGNOSIS — M5020 Other cervical disc displacement, unspecified cervical region: Secondary | ICD-10-CM

## 2024-02-28 NOTE — Progress Notes
 Darin A. Veria, MD [facility]  PreOperative History and Physical      CHIEF COMPLAINT     Chief Complaint   Patient presents with    Lower Back - Pain    Middle Back - Pain    Neck - Pain    Left Leg - Weakness, Tingling, Numbness    Right Leg - Weakness, Numbness, Tingling    Right Arm - Weakness, Numbness, Tingling    Left Arm - Weakness, Tingling, Numbness    Pre Operative Visit       HISTORY OF PRESENT ILLNESS      Interval HPI:   Darin Darin [name] is a 63 y.o. male.  He presents today for pre-op visit.  He is scheduled for surgery on 03/12/2024.  He continues to have right arm pain and symptoms.  Given his continued complaints, he has elected to pursue a surgical option.    Previous HPI:     11/08/2023 - Darin Darin [name] returns today to review CT findings.  He has persistent right upper arm pain in the area of the triceps.  He has intermittent spasming in the arm.  He describes worsening neurologic symptoms in his legs as well as spasming.     09/06/2023 - Patient follows up for MRI via telephone.  He reports symptoms are unchanged.  Patient complains of right upper extremity pain as well as right sided neck pain.  He also reports some weakness and numbness.  He describes worsening right lower extremity symptoms that have worsened over the last few months.  History of ACDF by Dr. Fernando at C5-6 in 2021; previous ACDF at C 3-4 and 4-5.  He is wanting to know what his options are for treatment.    06/21/2023 - Darin Darin [name] presents today for evaluation.  He is now 3 years 1 month s/p C5-6 ACDF on 05/14/2020.  He reports he had severe pain at the base of his skull running down the right side of his neck into his shoulder.  There is a spasm that runs up the side of his back more so on the right.  The neck started over a year ago.  He thought he would just deal with it for a while.  The shoulder and arm started last spring/summer.  He has some weakness of his right arm and hand, numbness in the hand and back of the upper arm.  He reports starting earlier this week his arm shakes pretty intensely when he lifts his arm.  Prior to surgery in 2021 he had the shakiness, including in the right leg, which he said is back to acting up now.         PAST MEDICAL HISTORY     Past Medical History:    Acid reflux    Adverse drug reaction    Back pain    Barrett's esophagus    Cancer of lung (CMS-HCC)    Coronary artery disease    Coronary atherosclerosis    Degenerative disc disease, cervical    Degenerative disc disease, thoracic    Difficult intubation    Dizziness    Dyspnea    Essential hypertension    GERD (gastroesophageal reflux disease)    History of heart artery stent    Hyperlipemia    Joint pain    Kidney disease    Lung disease    Nerve injury    Neuropathy    Other malignant neoplasm without specification of site  Spinal cord abscess    Spinal stenosis       PAST SURGICAL HISTORY     Surgical History:   Procedure Laterality Date    HX BACK SURGERY  1987    X2 for abscess after epidural injection    HX HEART CATHETERIZATION  2003    with stent placement x 1 on left side    BICEPS TENDON REPAIR Right 2005    SPINAL FUSION  2016    c3-c5 in [place]    ANTERIOR CERVICAL DISCECTOMY AND FUSION CERVICAL 5-6,  CAGE PLACEMENT CERVICAL 5-6 N/A 05/14/2020    Performed by Darin Penne NOVAK, MD at [facility] OR    ANTERIOR INSTRUMENTATION - 2 TO 3 VERTEBRAL SEGMENTS N/A 05/14/2020    Performed by Darin Penne NOVAK, MD at [facility] OR    ROBOT ASSISTED FLEXIBLE  BRONCHOSCOPY WITH IMAGE-GUIDED NAVIGATION- FLEXIBLE N/A 04/28/2022    Performed by Darin Drier, Maykol R, MD at [facility] OR    BRONCHOSCOPY DIAGNOSTIC WITH/ WITHOUT CELL WASHING - FLEXIBLE N/A 04/28/2022    Performed by Darin Drier, Maykol R, MD at [facility] OR    BRONCHOSCOPY WITH TRANSBRONCHIAL LUNG BIOPSY - FLEXIBLE - SINGLE LOBE N/A 04/28/2022    Performed by Darin Drier, Maykol R, MD at [facility] OR    BRONCHOSCOPY WITH ENDOBRONCHIAL ULTRASOUND GUIDED TRANSTRACHEAL/ TRANSBRONCHIAL SAMPLING - 3 OR MORE MEDIASTINAL/ HILAR LYMPH NODE STATIONS/ STRUCTURE - FLEXIBLE N/A 04/28/2022    Performed by Darin Drier, Maykol R, MD at [facility] OR    Robotic assisted video assisted thoracoscopy with RIGHT MIDDLE LOBE lobectomy, bronchoscopy Right 06/09/2022    Performed by Darin Dietrich RAMAN, MD at [facility] CVOR    Right robotic asissted THORACOSCOPY WITH MEDIASTINAL AND REGIONAL LYMPHADENECTOMY Right 06/09/2022    Performed by Darin Dietrich RAMAN, MD at [facility] CVOR    BRONCHOSCOPY DIAGNOSTIC - FLEXIBLE N/A 06/09/2022    Performed by Darin Dietrich RAMAN, MD at [facility] CVOR    ANGIOGRAPHY CORONARY ARTERY WITH LEFT HEART CATHETERIZATION N/A 11/03/2022    Performed by Darin Curtistine PARAS, MD at [facility] CATH LAB    PERCUTANEOUS CORONARY STENT PLACEMENT WITH ANGIOPLASTY N/A 11/03/2022    Performed by Darin Curtistine PARAS, MD at [facility] CATH LAB    BACLOFEN PUMP IMPLANTATION      since removed    CARDIAC CATHERIZATION  04/18/2002    CARDIOVASCULAR STRESS TEST  05/16/2022    COLONOSCOPY      CORONARY ANGIOPLASTY  04/18/2002    ECHOCARDIOGRAM PROCEDURE  05/11/2022    HX CORONARY STENT PLACEMENT  04/18/2002    HX TONSILLECTOMY  Unknown    LAMINECTOMY  12/27/1985    SURGERY      UMBILICAL ARTERIAL CATH - BEDSIDE  04/18/2002       FAMILY HISTORY   family history includes Alzheimer's in Darin mother; Cancer in Darin brother and Darin [name]; Cancer-Colon in Darin paternal grandmother; Coronary Artery Disease in Darin Darin [name]; Diabetes in Darin brother; Heart Attack in Darin brother; Heart Disease in Darin brother, brother, and paternal grandfather; Heart problem in Darin brother and brother; None Reported in Darin maternal grandfather, maternal grandmother, and son; Premature Heart Disease in Darin brother.    SOCIAL HISTORY     Social History     Socioeconomic History    Marital status: Married   Tobacco Use    Smoking status: Never    Smokeless tobacco: Never    Tobacco comments:     nO HX   Vaping Use  Vaping status: Never Used Substance and Sexual Activity    Alcohol use: Not Currently     Comment: maybe once a month    Drug use: Never     Comment: No HX    Sexual activity: Yes     Partners: Female     Birth control/protection: None       ALLERGIES   Allergies[1]    MEDICATIONS   Current Medications[2]    REVIEW OF SYSTEMS   Review of Systems   Musculoskeletal:  Positive for arthralgias, back pain and gait problem.   All other systems reviewed and are negative.    A 10-point ROS was otherwise negative.  PHYSICAL EXAM   Blood pressure 125/77, pulse 69, height 172.7 cm (5' 8), weight 95 kg (209 lb 6.4 oz), SpO2 99%.  Body mass index is 31.84 kg/m?SABRA    Oswestry Total Score:: (Patient-Rptd) 66  Pain Score: Nine    Constitutional: Alert, NAD  Psychiatric: Mood and affect appropriate  Eyes: EOMI  Respiratory: Unlabored respirations  Cardiovascular: Palpable radial and pedal pulses distally.  Skin: No rashes or lesions  Musculoskeletal:  Spine Exam:    Gait: Ambulates with a normal gait. Able to heel and toe walk without difficulty.   Stance: Balanced in the coronal and sagittal planes.     Upper Extremity Motor Exam    Deltoid Biceps Triceps WF WE Intrinsics Grip   RIGHT   4 4 4 4 4 4 4    LEFT 5 5 5 5 5 5 5                    There was significant clonic type activity with the examination of the RUE with all muscle groups. There was quite a bit of giving way with muscle testing. The exam was limited due to this clonic activity.      RADIOGRAPHIC EVALUATION     Outside CT scan cervical spine is available for review.  Shows implants from C3-4, C4-5, C5-6 are intact.  No evidence of loosening or failure.  There is bridging bone through the cages, behind the caging, and in front of the caging in some respects at all levels which indicate there is a solid fusion at all three of these levels.      MRI C-SPINE WO CONTRAST  Narrative: EXAM: MRI C-SPINE     HISTORY: Right arm weakness. History of cervical fusion.     Technique: Multiple sagittal and axial MR sequences were obtained of the cervical spine.     Comparison: MRI C-spine 12/23/2019. Spine radiograph 06/21/2023.     FINDINGS:      Prior ACDF C3-C4, C4-C5 and C5-C6. Artifact from hardware limits evaluation of adjacent structures.     Normal cervical alignment. Vertebral body heights are maintained. Normal marrow signal. 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 Unremarkable paraspinous soft tissues.     C2-C3: Facet and uncovertebral hypertrophy. Mild-to-moderate right neural foraminal stenosis.  C3-C4: Posterior disc osteophyte complex. Facet and uncovertebral hypertrophy. Mild central spinal and moderate right neural foraminal stenosis similar to the prior study.  C4-C5: Posterior disc osteophyte complex. Facet and uncovertebral hypertrophy. Moderate central spinal and bilateral neural foraminal stenosis.  C5-C6: Posterior disc osteophyte complex. Facet and uncovertebral hypertrophy. Limited evaluation of the central canal due to metallic artifact. Likely moderate to severe bilateral neural foraminal stenosis.  C6-C7: Posterior disc osteophyte complex. Facet and uncovertebral hypertrophy. Mildly limited evaluation due to  metallic artifact. Moderate central spinal stenosis, likely moderate to severe bilateral neural foraminal stenosis.  C7-T1: Posterior disc osteophyte complex. Facet and uncovertebral hypertrophy. Mild central spinal stenosis. Foramina are patent.  Impression: 1.  Exam limited by metallic artifact. Multilevel posterior disc osteophyte complexes and facet/uncovertebral hypertrophy results in central spinal and bilateral neural foraminal stenosis as above.  2.  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     By my electronic signature, I attest that I have personally reviewed the images for this examination and formulated the interpretations and opinions expressed in this report      Finalized by Franky Daring, M.D. on 07/27/2023 4:35 PM. Dictated by Ozell Memos, MD on 07/27/2023 3:21 PM.       SCOLIOSIS EOS AP/LAT  Narrative: Scoliosis Survey.     CLINICAL INDICATION: 63 years Male; Neck and back pain.     COMPARISON: 12/09/2019, SCOLIOSIS EOS AP/LAT  Impression: 1.  Unchanged ACDF C3-C4, C4-C5, with nearly fully incorporated hardware at C5-C6.     2.  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     3.  Otherwise mild diffuse thoracic and lumbar spondylosis.      Finalized by Redell Marsh, M.D. on 06/21/2023 1:22 PM. Dictated by Redell Marsh, M.D. on 06/21/2023 1:18 PM.          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     We have reviewed his CT scan and then gone back to his MRI from 07/27/2023.     MRI from 07/27/2023 had metal artifact at the levels of his previous operation but I don't see anything there concerning.     At the level distal to his fusion, which is C6-7, he has severe foraminal stenosis on the right and he also has narrowing centrally with complete effacement of the CSF and cord compression.  With his complaints of right posterior arm pain in the area of the triceps, I think that this certainly could match up with this C7 nerve root.     CT scan that we are reviewing today from 10/01/2023 indeed confirms he has solid fusion at C3-4, 4-5, 5-6.  I see bridging bone at all of these levels.  Given his pain and symptoms, as well as his new or worsening lower extremity neurologic symptoms that could be from cord compression, I think it is reasonable to consider revising or extending his fusion distally.     Given his previous operations, I would perform an ACDF at C6-7 with cage placement, decompression of the right neural foramen, and right C7 nerve root.  I would supplement this with a plate and screw construct this time as this is distal enough that I think it would not necessarily cause significant dysphagia.  We might reutilize his anterior incision that we used before; however, being that we are farther down there is another neck crease I might use that is a little lower.  Distally I might use a new native incision and I think  this may decrease the risks of encountering previous scar tissue.  Recovery and convalescence would be similar to his previous surgery and I think if he is interested in pursuing this.      With regards to the question of whether or not to use a metal titanium interbody cage versus structural allograft.  This question was posed by his insurance company during the authorization process.     To address this question or concern, I would say that in my experience of my practice over the last 7 years, the only pseudoarthroses I encountered have been with structural allograft.  I believe that this is largely because of it is inert bone and has a higher likelihood that the body may or may not incorporate it versus during the bone formation and incorporation process, an allograft product can be degraded during the osteoclastic reaction.  In all of the scenarios where I encountered a pseudoarthrosis, revising it to a 3D printed roughened titanium cage that has a larger footprint and a larger graft window allows me to put more bone inside the graft window while maintaining a structural graft that the body can incorporate into (the 3D roughened surface).  Despite the comment by the insurance denial letter, there excellent evidence supporting 3D printed titanium cages and that they have high fusion potential.  The other reasons I would say that using a titanium cage provides advantages in this type of the surgery for me would be that this gives me more options for sizing in terms of both height as well as and more importantly anterior posterior and medial lateral.  With the largest footprint implant, I believe that I can place the cage onto the apophyseal rings out laterally while not compromising the endplates.  This gives increase structural stability to the implant itself and decreases the rates of subsidence.  This allows for a large graft window where we placed local autograft, allograft and/or iliac crest bone graft if necessary.  With a 1 level operation I would anticipate plenty of local autograft and possibly supplement with allograft.     Please let the above statement serves as my letter of medical necessity for any sort of appeal related to the cage code [id number].  I have discussed this in depth with the patient.  It should also be noted that the patient has had 3 previous fusions in his neck and all of which have synthetic cages and none of which are allograft.  The patient is interested in another titanium interbody cage based on the logic above.       We discussed the risks, potential benefits and alternatives to surgical intervention. Specifically we discussed risk of wound complications, draining wounds, infection, bleeding potentially requiring transfusion, dural tear, nerve injury, weakness, numbness, fracture, deformity, and possible future surgery.        Surgical procedure:  ANTERIOR CERVICAL DISCECTOMY AND FUSION CERVICAL 6-7, ANTERIOR PLATE PLACEMENT, INTERBODY CAGE PLACEMENT, POSSIBLE ILIAC CREST BONE GRAFT.      RAPT SCORE  What is your age group?: 92 - 43 yr. old  What is your gender?: Male  How far, on average, can patient walk? : Housebound (most of the time)  Which gait aid do you use?: Single-point stick  Do you use community supports?: None or 1 per week  Will you live with someone who can care for you after your operation?: Yes  RAPT Score: 9      Short-Acting Medications  Hydrocodone  IR Dosage (mg): 5  #  of Hydrocodone  IR tablets in 24 hours: 5         MME Total Score  MME Score: 25           In the presence of Penne KATHEE Rubenstein, MD , I have taken down these notes, Joan Barefoot, Scribe. February 28, 2024 4:16 PM    Penne B. Rubenstein, MD, MPH  Spinal Surgery  [facility], [facility]  Nurse: Olam Danas, BSN, RN, CNOR   [phone]  -  [email or web address]           [1]   Allergies  Allergen Reactions    Clonazepam  RASH    Morphine VOMITING     Patient tolerates Hydrocodone -APAP 5/325 with best success. Most other pain medications cause vomiting   [2]   Current Outpatient Medications:     aspirin  EC (ASPIR-LOW) 81 mg tablet, Take one tablet by mouth daily., Disp: , Rfl:     carisoprodoL (SOMA) 350 mg tablet, Take one tablet by mouth three times daily., Disp: , Rfl:     esomeprazole DR(+) (NEXIUM) 20 mg capsule, Take one capsule by mouth every morning. Take on an empty stomach at least 1 hour before or 2 hours after food., Disp: , Rfl:     evolocumab  (REPATHA  SURECLICK) 140 mg/mL injectable PEN, Inject 1 mL under the skin every 14 days. (Patient taking differently: Inject 1 mL under the skin every 14 days. Next injection 03/03/24), Disp: 6 mL, Rfl: 3    gabapentin  (NEURONTIN ) 300 mg capsule, Take three capsules by mouth three times daily., Disp: 810 capsule, Rfl: 1    HYDROcodone /acetaminophen  (NORCO) 5/325 mg tablet, Take one tablet to two tablets by mouth every 4 hours as needed. Do not start before June 29, 2022., Disp: 50 tablet, Rfl: 0    mv-min/folic/K1/lycopen/lutein (CENTRUM SILVER MEN PO), Take 1 tablet by mouth daily., Disp: , Rfl:     tamsulosin  (FLOMAX ) 0.4 mg capsule, Take two capsules by mouth daily. Take 30 min after same meal.  Do not cut/ crush/ chew. (Patient taking differently: Take two capsules by mouth at bedtime daily. Take 30 min after same meal.  Do not cut/ crush/ chew.), Disp: 180 capsule, Rfl: 3

## 2024-03-11 ENCOUNTER — Encounter: Admit: 2024-03-11 | Discharge: 2024-03-11 | Payer: MEDICARE

## 2024-03-12 ENCOUNTER — Encounter: Admit: 2024-03-12 | Discharge: 2024-03-12 | Payer: MEDICARE

## 2024-03-13 ENCOUNTER — Encounter: Admit: 2024-03-13 | Discharge: 2024-03-13 | Payer: MEDICARE

## 2024-03-13 MED FILL — HYDROCODONE-ACETAMINOPHEN 5-325 MG PO TAB: 5325 mg | ORAL | 7 days supply | Qty: 40 | Fill #0 | Status: CP

## 2024-03-14 ENCOUNTER — Encounter: Admit: 2024-03-14 | Discharge: 2024-03-14 | Payer: MEDICARE

## 2024-03-18 ENCOUNTER — Encounter: Admit: 2024-03-18 | Discharge: 2024-03-18 | Payer: MEDICARE

## 2024-03-18 DIAGNOSIS — R972 Elevated prostate specific antigen [PSA]: Principal | ICD-10-CM

## 2024-03-19 ENCOUNTER — Encounter: Admit: 2024-03-19 | Discharge: 2024-03-19 | Payer: MEDICARE

## 2024-03-19 ENCOUNTER — Ambulatory Visit: Admit: 2024-03-19 | Discharge: 2024-03-19 | Payer: MEDICARE

## 2024-03-20 ENCOUNTER — Encounter: Admit: 2024-03-20 | Discharge: 2024-03-20 | Payer: MEDICARE

## 2024-03-21 ENCOUNTER — Encounter: Admit: 2024-03-21 | Discharge: 2024-03-21 | Payer: MEDICARE

## 2024-03-26 ENCOUNTER — Encounter: Admit: 2024-03-26 | Discharge: 2024-03-26 | Payer: MEDICARE

## 2024-03-31 ENCOUNTER — Encounter: Admit: 2024-03-31 | Discharge: 2024-03-31 | Payer: MEDICARE

## 2024-03-31 DIAGNOSIS — Z09 Encounter for follow-up examination after completed treatment for conditions other than malignant neoplasm: Principal | ICD-10-CM

## 2024-03-31 MED ORDER — CYCLOBENZAPRINE 5 MG PO TAB
5 mg | ORAL_TABLET | Freq: Three times a day (TID) | ORAL | 1 refills | 30.00000 days | Status: AC
Start: 2024-03-31 — End: ?

## 2024-03-31 NOTE — Patient Instructions
 It was a pleasure seeing you in clinic today.  Please don't hesitate to call or send a MyChart message if you have any questions.     DO NOT call or send a MyChart message asking for any test or imaging (MRI, CT, etc.) results.  All results will be discussed with you at your follow-up appointment.     Laverle Patter RN, BSN  Clinical Nurse Coordinator  Dr. Greer Pickerel  Alexsis Johnson APRN-NP   Coralie Common PA  Vaiden Darland PA  Sue Lush PA  [facility]  [facility]  [address]  [place], [place] [address]  [email or web address]  Phone: [phone]  Fax:  [phone]  Scheduling [phone]

## 2024-04-03 ENCOUNTER — Encounter: Admit: 2024-04-03 | Discharge: 2024-04-03 | Payer: MEDICARE

## 2024-04-08 ENCOUNTER — Ambulatory Visit: Admit: 2024-04-08 | Discharge: 2024-04-08 | Payer: MEDICARE

## 2024-04-08 ENCOUNTER — Encounter: Admit: 2024-04-08 | Discharge: 2024-04-08 | Payer: MEDICARE

## 2024-04-08 VITALS — BP 156/95 | HR 75 | Ht 68.0 in | Wt 200.0 lb

## 2024-04-08 DIAGNOSIS — M4802 Spinal stenosis, cervical region: Secondary | ICD-10-CM

## 2024-04-08 DIAGNOSIS — M542 Cervicalgia: Principal | ICD-10-CM

## 2024-04-08 DIAGNOSIS — Z981 Arthrodesis status: Secondary | ICD-10-CM

## 2024-04-08 DIAGNOSIS — M5412 Radiculopathy, cervical region: Secondary | ICD-10-CM

## 2024-04-08 DIAGNOSIS — M25512 Pain in left shoulder: Secondary | ICD-10-CM

## 2024-04-08 NOTE — Progress Notes
 Darin A. Veria, MD [facility]  Follow - Up Visit  Subjective     REASON FOR VISIT   Pain of the Neck and Pain, Numbness, and Tingling of the Left Arm    SUBJECTIVE     Darin [name] returns today for repeat evaluation of his cervical spine.  He is now a month out from his C6-7 ACDF.  This was done for right arm pain.  He presents today stating that his right arm pain is nearly 100% resolved.  He has some moderate neck discomfort and also some hoarseness in his voice.  He is having some difficulty swallowing solids as well.  Although he has done well from the surgery, he woke up about 2 weeks ago with a sharp stabbing pain underneath his left scapula.  He also has some pain in his left shoulder as well as pain that radiates down into his below the elbow and his left hand.  Reports he has never had the symptoms before until about 2 weeks ago.  He denies traumatic event that would correlate with that happening.       ROS: Review of Systems   Musculoskeletal:  Positive for arthralgias, neck pain and neck stiffness.   All other systems reviewed and are negative.    A 10-point ROS was performed and negative.    PHYSICAL EXAM   Blood pressure (!) 156/95, pulse 75, height 172.7 cm (5' 8), weight 90.7 kg (200 lb), SpO2 97%.  Body mass index is 30.41 kg/m?SABRA  Oswestry Total Score:: (Patient-Rptd) 68  Pain Score: Nine    Constitutional: Alert, NAD  Head: Atraumatic  Eyes: EOMI  Respiratory: Unlabored breathing  Cardiovascular: Regular rate  Skin: No rashes or open wounds appreciated on back  Musculoskeletal: Strength stable  Neurologic: Sensation stable    Incision is well-healed.  Skin edges are approximated.  No open areas.  Examination of his left shoulder shows exquisite pain with subtle manipulation.  Positive impingement maneuver.  Motor examination is full including deltoids, triceps, biceps, wrist extensors, and hand intrinsics.    RADIOGRAPHS     C SPINE AP & LAT  Narrative: EXAM: C SPINE AP & LAT    CLINICAL INDICATION: 62 years ACDF 03/12/24.    COMPARISON: 03/13/2024, C SPINE AP & LAT.  Impression: 1.  C3-C7 ACDF. Intact hardware with unchanged alignment.    2.  Mild degenerative lateral atlantoaxial joint arthrosis. Multilevel facet arthropathy.     Finalized by Jayson Riles, M.D. on 04/08/2024 3:08 PM. Dictated by Jayson Riles, M.D. on 04/08/2024 3:06 PM.       ASSESSMENT / PLAN     Darin [name] is a 63 y.o. male with     1. Neck pain        2. Cervical stenosis of spine        3. Cervical radiculopathy        4. Acute pain of left shoulder        5. S/P cervical spinal fusion            Had a lengthy discussion with the patient regarding the problem and treatment alternatives.  We did review his x-rays with him as well as the pathoanatomy and pathophysiology problem.  Overall the symptoms that he had before surgery are significantly improved.  His new complaint of left shoulder pain started about 2 weeks ago without any sort of traumatic event.  I think at least part of his problem is in  his left shoulder joint itself.  However, that would not explain symptoms below the elbow into his hand.  I encouraged him to give this a little bit of time and see what the next several weeks to months bring.  Certainly if his left shoulder pain is not getting better we could have him see a shoulder specialist but overall I would encourage him to give this a little bit of time and see if things improve.  Encouraged him to continue to be careful about things and avoid extreme motions with his neck as well as strenuous activities.  Organ to plan to see him back in the office in 2 months and recheck things.  If he gets worse in that period of time like for him to give us  a call and we can adjust our scheduling we will get him in to see someone else sooner for evaluation of his shoulder.  We will plan to see him back in the office in 2 months.       Darin IVAR Llano, PA-C   Physician Assistant to Penne NOVAK. Fernando, MD, MPH  Spinal Surgery  [facility] V[facility] MD, [facility]  Nurse: Olam Danas, BSN, RN, CNOR   [phone]  -  [email or web address]

## 2024-04-17 ENCOUNTER — Encounter: Admit: 2024-04-17 | Discharge: 2024-04-17 | Payer: MEDICARE

## 2024-04-17 MED ORDER — METHYLPREDNISOLONE 4 MG PO DSPK
ORAL_TABLET | ORAL | 0 refills | 6.00000 days | Status: AC
Start: 2024-04-17 — End: ?

## 2024-04-23 ENCOUNTER — Encounter: Admit: 2024-04-23 | Discharge: 2024-04-23 | Payer: MEDICARE

## 2024-04-23 ENCOUNTER — Ambulatory Visit: Admit: 2024-04-23 | Discharge: 2024-04-23 | Payer: MEDICARE

## 2024-04-25 ENCOUNTER — Encounter: Admit: 2024-04-25 | Discharge: 2024-04-25 | Payer: MEDICARE

## 2024-05-02 ENCOUNTER — Encounter: Admit: 2024-05-02 | Discharge: 2024-05-02 | Payer: MEDICARE

## 2024-05-12 ENCOUNTER — Encounter: Admit: 2024-05-12 | Discharge: 2024-05-12 | Payer: MEDICARE

## 2024-05-13 ENCOUNTER — Encounter: Admit: 2024-05-13 | Discharge: 2024-05-13 | Payer: MEDICARE

## 2024-05-14 ENCOUNTER — Encounter: Admit: 2024-05-14 | Discharge: 2024-05-14 | Payer: MEDICARE

## 2024-05-15 MED FILL — REPATHA SURECLICK 140 MG/ML SC PNIJ: 140 mg/mL | SUBCUTANEOUS | 84 days supply | Qty: 6 | Fill #1 | Status: AC

## 2024-05-27 NOTE — Progress Notes [1]
 Telehealth Visit Note    Date of Service: 06/03/2024    Subjective:       Darin [name] is a 63 y.o. male.    History of Present Illness  Darin [name] returns to thoracic surgery clinic for follow up. He is a 63 y.o. 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 1 drug-eluting stent was successfully placed in that vessel.          Last CT chest 06/04/23  1.  Stable right middle lobectomy changes without evidence of recurrent or residual lung neoplasm.   2.  No thoracic lymphadenopathy.   3.  At least moderate coronary artery calcification with stents.           06/02/24 CT Chest   IMPRESSION   CHEST:   1. Right middle lobectomy.   2.  No thoracic metastatic disease.   ABDOMEN AND PELVIS:   1.  No abdominopelvic metastatic disease.       03/12/2024 ant cervical discectomy and fusion, plate, spine surgery    Shortness of breath comes and goes. Limited physically recently due to recent cervical spine surgery. On restrictions still. No cough. Occasional dizziness with position changes.           Objective   Objective:          aspirin  EC (ASPIR-LOW) 81 mg tablet Take one tablet by mouth daily. (Patient not taking: Reported on 06/03/2024)    carisoprodoL (SOMA) 350 mg tablet Take one tablet by mouth three times daily.    cyclobenzaprine  (FLEXERIL ) 5 mg tablet Take one tablet by mouth three times daily. (Patient not taking: Reported on 06/03/2024)    esomeprazole DR(+) (NEXIUM) 20 mg capsule Take one capsule by mouth every morning. Take on an empty stomach at least 1 hour before or 2 hours after food.    evolocumab  (REPATHA  SURECLICK) 140 mg/mL injectable PEN Inject 1 mL under the skin every 14 days.    gabapentin  (NEURONTIN ) 300 mg capsule Take three capsules by mouth three times daily.    HYDROcodone /acetaminophen  (NORCO) 5/325 mg tablet Take one tablet by mouth every 4 hours as needed. Indications: pain    methylPREDNIsolone  (MEDROL  (PAK)) 4 mg tablet Take medication as directed on package for 6 days. Take with food. (Patient not taking: Reported on 06/03/2024)    mv-min/folic/K1/lycopen/lutein (CENTRUM SILVER MEN PO) Take 1 tablet by mouth daily.    tadalafiL (CIALIS) 5 mg tablet Take one tablet by mouth daily.    tamsulosin  (FLOMAX ) 0.4 mg capsule Take two capsules by mouth daily. Take 30 min after same meal.  Do not cut/ crush/ chew.      Telehealth Patient Reported Vitals       Row Name 06/03/24 0912                Weight: 90.7 kg (200 lb)        Height: 172.7 cm (5' 8)            Telehealth Body Mass Index: [id number] at 06/03/2024  9:21 AM  Physical Exam  Constitutional:       Appearance: Normal appearance.   HENT:      Head: Normocephalic.   Pulmonary:      Effort: Pulmonary effort  is normal.   Neurological:      Mental Status: Darin [name] is alert.             Assessment and Plan:  1. Neuroendocrine tumor (CMS-HCC)  CT CHEST WO CONTRAST      2. Encounter for follow-up surveillance of lung cancer  CT CHEST WO CONTRAST        S/p robotic RML 06/2022 for a Typical carcinoid tumor, 2.1 cm, STAS present, pT1cN0.    No evidence of recurrence.     Return to clinic in 12 months for ongoing surveillance with a CT chest wo contrast.     Michal Alexander, APRN  Cardiothoracic Surgery

## 2024-06-01 ENCOUNTER — Encounter: Admit: 2024-06-01 | Discharge: 2024-06-01 | Payer: MEDICARE

## 2024-06-02 ENCOUNTER — Ambulatory Visit: Admit: 2024-06-02 | Discharge: 2024-06-02 | Payer: MEDICARE

## 2024-06-02 ENCOUNTER — Encounter: Admit: 2024-06-02 | Discharge: 2024-06-02 | Payer: MEDICARE

## 2024-06-03 ENCOUNTER — Ambulatory Visit: Admit: 2024-06-03 | Discharge: 2024-06-04 | Payer: MEDICARE

## 2024-06-03 ENCOUNTER — Encounter: Admit: 2024-06-03 | Discharge: 2024-06-03 | Payer: MEDICARE

## 2024-06-03 VITALS — Ht 68.0 in | Wt 200.0 lb

## 2024-06-03 DIAGNOSIS — Z08 Encounter for follow-up examination after completed treatment for malignant neoplasm: Secondary | ICD-10-CM

## 2024-06-03 DIAGNOSIS — D3A8 Other benign neuroendocrine tumors: Principal | ICD-10-CM

## 2024-06-04 ENCOUNTER — Encounter: Admit: 2024-06-04 | Discharge: 2024-06-04 | Payer: MEDICARE

## 2024-06-04 DIAGNOSIS — Z09 Encounter for follow-up examination after completed treatment for conditions other than malignant neoplasm: Principal | ICD-10-CM

## 2024-06-04 NOTE — Patient Instructions [37]
 It was a pleasure seeing you in clinic today.  Please don't hesitate to call or send a MyChart message if you have any questions.     DO NOT call or send a MyChart message asking for any test or imaging (MRI, CT, etc.) results.  All results will be discussed with you at your follow-up appointment.     Olam Danas RN, BSN  Clinical Nurse Coordinator  Dr. Penne Rubenstein  Alexsis Johnson APRN-NP   [place] Darland PA  Lani Llano PA  [facility]  [facility]  [address]  [place], [place] [address]  [email or web address]  Phone: [phone]  Fax:  [phone]  Scheduling [phone]

## 2024-06-09 ENCOUNTER — Encounter: Admit: 2024-06-09 | Discharge: 2024-06-09 | Payer: MEDICARE

## 2024-06-11 ENCOUNTER — Encounter: Admit: 2024-06-11 | Discharge: 2024-06-11 | Payer: MEDICARE

## 2024-06-12 ENCOUNTER — Encounter: Admit: 2024-06-12 | Discharge: 2024-06-12 | Payer: MEDICARE

## 2024-06-12 ENCOUNTER — Ambulatory Visit: Admit: 2024-06-12 | Discharge: 2024-06-12 | Payer: MEDICARE

## 2024-06-12 VITALS — BP 126/71 | HR 61 | Temp 97.10000°F | Ht 68.0 in | Wt 204.8 lb

## 2024-06-12 DIAGNOSIS — M25512 Pain in left shoulder: Secondary | ICD-10-CM

## 2024-06-12 DIAGNOSIS — M5412 Radiculopathy, cervical region: Secondary | ICD-10-CM

## 2024-06-12 DIAGNOSIS — Z981 Arthrodesis status: Secondary | ICD-10-CM

## 2024-06-12 DIAGNOSIS — Z09 Encounter for follow-up examination after completed treatment for conditions other than malignant neoplasm: Principal | ICD-10-CM

## 2024-06-12 DIAGNOSIS — M4802 Spinal stenosis, cervical region: Secondary | ICD-10-CM

## 2024-06-12 DIAGNOSIS — M542 Cervicalgia: Secondary | ICD-10-CM

## 2024-06-12 DIAGNOSIS — T84216A Breakdown (mechanical) of internal fixation device of vertebrae, initial encounter: Secondary | ICD-10-CM

## 2024-06-12 NOTE — Progress Notes [1]
 Darin A. Veria, MD [facility]  Follow - Up Visit  Subjective     REASON FOR VISIT   Pain and Stiffness of the Neck, Pain of the Right Shoulder, and Follow Up (3 mo po. DOS 03/12/24)    SUBJECTIVE     Darin [name] returns for follow up 3 months s/p C6-7 ACDF on 03/12/2024.  He reports his pain is much improved.  He does note in October he woke up and felt like he was stabbed in the shoulder blade and then his entire left side down the arm was painful, which eventually worked itself out with the help of a Medrol  Dosepak.         ROS: Review of Systems   Musculoskeletal:  Positive for arthralgias, neck pain and neck stiffness.   All other systems reviewed and are negative.    A 10-point ROS was performed and negative.    PHYSICAL EXAM   Blood pressure 126/71, pulse 61, temperature 36.2 ?C (97.1 ?F), temperature source Temporal, height 172.7 cm (5' 8), weight 92.9 kg (204 lb 12.8 oz), SpO2 98%.  Body mass index is 31.14 kg/m?SABRA  Oswestry Total Score:: (Patient-Rptd) 46  Pain Score: Three    Constitutional: Alert, NAD  Head: Atraumatic  Eyes: EOMI  Respiratory: Unlabored breathing  Cardiovascular: Regular rate  Skin: No rashes or open wounds appreciated on back  Musculoskeletal: Strength stable  Neurologic: Sensation stable    Unchanged.    RADIOGRAPHS       C-spine films from today demonstrate multi-level cervical fusion.  C6-7 is the new construct.  The plate and screw construct is again noted.  It does look like the distal screws at least one of them has backed out of the plate.  The cage looks like it is in similar position to his previous exams.  Interestingly, the upper screws, I think there is a lucency at the junction just past the plate, which makes me suspicious that he has had a screw fracture here.         ASSESSMENT / PLAN     Darin [name] is a 63 y.o. male with     1. Surgery follow-up examination  CT SPINE CERVICAL WO CONTRAST      2. Neck pain  CT SPINE CERVICAL WO CONTRAST 3. Cervical stenosis of spine  CT SPINE CERVICAL WO CONTRAST      4. Cervical radiculopathy  CT SPINE CERVICAL WO CONTRAST      5. Acute pain of left shoulder  CT SPINE CERVICAL WO CONTRAST      6. S/P cervical spinal fusion  CT SPINE CERVICAL WO CONTRAST      7. Mechanical breakdown of internal fixation device of vertebrae, initial encounter  CT SPINE CERVICAL WO CONTRAST          I think based on the changes in the radiographs that I see, it would be worth investigating further and ensuring we fully understand what is going on with the hardware here.  The screw backing out at the bottom of the plate is not a common scenario with this plate and I also don't tend to see broken screws at the three month interval.  I do see some bone that is growing posterior to the cage, actually, and I think getting a CT at this time point would better understand how close of an evaluation we need or what time intervals to continue to follow him, whether it is a shorter interval than  my usual six months post-op or if we would want to check on him maybe closer or at a shorter interval to ensure we understand what might be involving the instrumentation here.         In the presence of Darin KATHEE Rubenstein, MD , I have taken down these notes, Darin [name], Scribe. June 12, 2024 12:09 PM    Darin B. Rubenstein, MD, MPH  Spinal Surgery  [facility], [facility]  Nurse: Darin [name], BSN, RN, CNOR   [phone]  -  [email or web address]

## 2024-07-14 ENCOUNTER — Encounter: Admit: 2024-07-14 | Discharge: 2024-07-14 | Payer: MEDICARE

## 2024-07-16 ENCOUNTER — Encounter: Admit: 2024-07-16 | Discharge: 2024-07-16 | Payer: MEDICARE

## 2024-07-18 ENCOUNTER — Encounter: Admit: 2024-07-18 | Discharge: 2024-07-18 | Payer: MEDICARE

## 2024-07-25 ENCOUNTER — Encounter: Admit: 2024-07-25 | Discharge: 2024-07-25 | Payer: MEDICARE

## 2024-07-25 NOTE — Progress Notes [1]
 Initiated PA on CMM for Tadalafil 5mg  for BPH with KEY: [id number]    Awaiting determination.     Totality of attempt .

## 2024-08-06 ENCOUNTER — Encounter: Admit: 2024-08-06 | Discharge: 2024-08-06 | Payer: MEDICARE

## 2024-08-06 ENCOUNTER — Ambulatory Visit: Admit: 2024-08-06 | Discharge: 2024-08-07 | Payer: MEDICARE

## 2024-08-07 ENCOUNTER — Encounter: Admit: 2024-08-07 | Discharge: 2024-08-07 | Payer: MEDICARE
